# Patient Record
Sex: Male | Born: 1965 | Race: White | Hispanic: No | Smoking: Current every day smoker
Health system: Southern US, Community
[De-identification: ages and names within clinical notes are randomized; demographics above are authoritative.]

## PROBLEM LIST (undated history)

## (undated) DIAGNOSIS — K4020 Bilateral inguinal hernia, without obstruction or gangrene, not specified as recurrent: Secondary | ICD-10-CM

## (undated) DIAGNOSIS — G709 Myoneural disorder, unspecified: Secondary | ICD-10-CM

## (undated) DIAGNOSIS — F1290 Cannabis use, unspecified, uncomplicated: Secondary | ICD-10-CM

## (undated) DIAGNOSIS — M51369 Other intervertebral disc degeneration, lumbar region without mention of lumbar back pain or lower extremity pain: Secondary | ICD-10-CM

## (undated) DIAGNOSIS — J449 Chronic obstructive pulmonary disease, unspecified: Secondary | ICD-10-CM

## (undated) DIAGNOSIS — M5136 Other intervertebral disc degeneration, lumbar region: Secondary | ICD-10-CM

## (undated) DIAGNOSIS — I4891 Unspecified atrial fibrillation: Secondary | ICD-10-CM

## (undated) DIAGNOSIS — M1990 Unspecified osteoarthritis, unspecified site: Secondary | ICD-10-CM

## (undated) DIAGNOSIS — I700 Atherosclerosis of aorta: Secondary | ICD-10-CM

## (undated) DIAGNOSIS — I499 Cardiac arrhythmia, unspecified: Secondary | ICD-10-CM

## (undated) DIAGNOSIS — I480 Paroxysmal atrial fibrillation: Secondary | ICD-10-CM

## (undated) DIAGNOSIS — R011 Cardiac murmur, unspecified: Secondary | ICD-10-CM

## (undated) DIAGNOSIS — M48061 Spinal stenosis, lumbar region without neurogenic claudication: Secondary | ICD-10-CM

## (undated) DIAGNOSIS — F1190 Opioid use, unspecified, uncomplicated: Secondary | ICD-10-CM

## (undated) DIAGNOSIS — K219 Gastro-esophageal reflux disease without esophagitis: Secondary | ICD-10-CM

## (undated) DIAGNOSIS — E785 Hyperlipidemia, unspecified: Secondary | ICD-10-CM

## (undated) DIAGNOSIS — Z7901 Long term (current) use of anticoagulants: Secondary | ICD-10-CM

## (undated) HISTORY — PX: SPINE SURGERY: SHX786

## (undated) HISTORY — DX: Unspecified atrial fibrillation: I48.91

## (undated) HISTORY — PX: PATELLA FRACTURE SURGERY: SHX735

## (undated) HISTORY — DX: Unspecified osteoarthritis, unspecified site: M19.90

## (undated) HISTORY — DX: Hyperlipidemia, unspecified: E78.5

## (undated) HISTORY — PX: HERNIA REPAIR: SHX51

## (undated) HISTORY — DX: Myoneural disorder, unspecified: G70.9

## (undated) HISTORY — DX: Gastro-esophageal reflux disease without esophagitis: K21.9

## (undated) HISTORY — DX: Cardiac arrhythmia, unspecified: I49.9

## (undated) HISTORY — PX: FRACTURE SURGERY: SHX138

## (undated) HISTORY — DX: Spinal stenosis, lumbar region without neurogenic claudication: M48.061

## (undated) HISTORY — DX: Chronic obstructive pulmonary disease, unspecified: J44.9

---

## 2019-03-30 DIAGNOSIS — I2510 Atherosclerotic heart disease of native coronary artery without angina pectoris: Secondary | ICD-10-CM

## 2019-03-30 HISTORY — DX: Atherosclerotic heart disease of native coronary artery without angina pectoris: I25.10

## 2019-03-30 HISTORY — PX: CARDIAC CATHETERIZATION: SHX172

## 2020-09-29 HISTORY — PX: POSTERIOR LUMBAR FUSION: SHX6036

## 2020-09-29 HISTORY — PX: LAMINECTOMY: SHX219

## 2021-05-22 ENCOUNTER — Ambulatory Visit
Admission: RE | Admit: 2021-05-22 | Discharge: 2021-05-22 | Disposition: A | Discharge status: Home / Self Care | Payer: Medicare Other | Attending: Neurological Surgery | Admitting: Neurological Surgery

## 2021-05-22 ENCOUNTER — Ambulatory Visit
Admission: RE | Admit: 2021-05-22 | Discharge: 2021-05-22 | Disposition: A | Discharge status: Home / Self Care | Payer: Medicare Other | Source: Ambulatory Visit | Attending: Neurological Surgery | Admitting: Neurological Surgery

## 2021-05-22 ENCOUNTER — Other Ambulatory Visit: Payer: Self-pay | Admitting: Neurological Surgery

## 2021-05-22 DIAGNOSIS — M4316 Spondylolisthesis, lumbar region: Secondary | ICD-10-CM

## 2021-05-22 DIAGNOSIS — M48061 Spinal stenosis, lumbar region without neurogenic claudication: Secondary | ICD-10-CM

## 2021-05-30 ENCOUNTER — Encounter: Payer: Self-pay | Admitting: Internal Medicine

## 2021-05-30 ENCOUNTER — Ambulatory Visit (INDEPENDENT_AMBULATORY_CARE_PROVIDER_SITE_OTHER): Payer: Medicare Other | Admitting: Internal Medicine

## 2021-05-30 DIAGNOSIS — E782 Mixed hyperlipidemia: Secondary | ICD-10-CM | POA: Diagnosis not present

## 2021-05-30 DIAGNOSIS — M48061 Spinal stenosis, lumbar region without neurogenic claudication: Secondary | ICD-10-CM | POA: Diagnosis not present

## 2021-05-30 DIAGNOSIS — I4811 Longstanding persistent atrial fibrillation: Secondary | ICD-10-CM

## 2021-05-30 DIAGNOSIS — E785 Hyperlipidemia, unspecified: Secondary | ICD-10-CM | POA: Insufficient documentation

## 2021-05-30 DIAGNOSIS — J410 Simple chronic bronchitis: Secondary | ICD-10-CM

## 2021-05-30 DIAGNOSIS — I4891 Unspecified atrial fibrillation: Secondary | ICD-10-CM | POA: Insufficient documentation

## 2021-05-30 DIAGNOSIS — J449 Chronic obstructive pulmonary disease, unspecified: Secondary | ICD-10-CM | POA: Insufficient documentation

## 2021-05-30 MED ORDER — TRAMADOL HCL 50 MG PO TABS: 50.0000 mg | ORAL_TABLET | Freq: Two times a day (BID) | ORAL | 0 refills | Status: AC

## 2021-05-30 MED ORDER — TRAMADOL HCL 50 MG PO TABS: 50.0000 mg | ORAL_TABLET | Freq: Three times a day (TID) | ORAL | 0 refills | Status: DC | PRN

## 2021-05-30 NOTE — Patient Instructions (Signed)
Heart-Healthy Eating Plan Heart-healthy meal planning includes: Eating less unhealthy fats. Eating more healthy fats. Making other changes in your diet. Talk with your doctor or a diet specialist (dietitian) to create an eating plan that is right for you. What is my plan? Your doctor may recommend an eating plan that includes: Total fat: ______% or less of total calories a day. Saturated fat: ______% or less of total calories a day. Cholesterol: less than _________mg a day. What are tips for following this plan? Cooking Avoid frying your food. Try to bake, boil, grill, or broil it instead. You can also reduce fat by: Removing the skin from poultry. Removing all visible fats from meats. Steaming vegetables in water or broth. Meal planning  At meals, divide your plate into four equal parts: Fill one-half of your plate with vegetables and green salads. Fill one-fourth of your plate with whole grains. Fill one-fourth of your plate with lean protein foods. Eat 4-5 servings of vegetables per day. A serving of vegetables is: 1 cup of raw or cooked vegetables. 2 cups of raw leafy greens. Eat 4-5 servings of fruit per day. A serving of fruit is: 1 medium whole fruit.  cup of dried fruit.  cup of fresh, frozen, or canned fruit.  cup of 100% fruit juice. Eat more foods that have soluble fiber. These are apples, broccoli, carrots, beans, peas, and barley. Try to get 20-30 g of fiber per day. Eat 4-5 servings of nuts, legumes, and seeds per week: 1 serving of dried beans or legumes equals  cup after being cooked. 1 serving of nuts is  cup. 1 serving of seeds equals 1 tablespoon. General information Eat more home-cooked food. Eat less restaurant, buffet, and fast food. Limit or avoid alcohol. Limit foods that are high in starch and sugar. Avoid fried foods. Lose weight if you are overweight. Keep track of how much salt (sodium) you eat. This is important if you have high blood  pressure. Ask your doctor to tell you more about this. Try to add vegetarian meals each week. Fats Choose healthy fats. These include olive oil and canola oil, flaxseeds, walnuts, almonds, and seeds. Eat more omega-3 fats. These include salmon, mackerel, sardines, tuna, flaxseed oil, and ground flaxseeds. Try to eat fish at least 2 times each week. Check food labels. Avoid foods with trans fats or high amounts of saturated fat. Limit saturated fats. These are often found in animal products, such as meats, butter, and cream. These are also found in plant foods, such as palm oil, palm kernel oil, and coconut oil. Avoid foods with partially hydrogenated oils in them. These have trans fats. Examples are stick margarine, some tub margarines, cookies, crackers, and other baked goods. What foods can I eat? Fruits All fresh, canned (in natural juice), or frozen fruits. Vegetables Fresh or frozen vegetables (raw, steamed, roasted, or grilled). Green salads. Grains Most grains. Choose whole wheat and whole grains most of the time. Rice and pasta, including brown rice and pastas made with whole wheat. Meats and other proteins Lean, well-trimmed beef, veal, pork, and lamb. Chicken and turkey without skin. All fish and shellfish. Wild duck, rabbit, pheasant, and venison. Egg whites or low-cholesterol egg substitutes. Dried beans, peas, lentils, and tofu. Seeds and most nuts. Dairy Low-fat or nonfat cheeses, including ricotta and mozzarella. Skim or 1% milk that is liquid, powdered, or evaporated. Buttermilk that is made with low-fat milk. Nonfat or low-fat yogurt. Fats and oils Non-hydrogenated (trans-free) margarines. Vegetable oils, including   soybean, sesame, sunflower, olive, peanut, safflower, corn, canola, and cottonseed. Salad dressings or mayonnaise made with a vegetable oil. Beverages Mineral water. Coffee and tea. Diet carbonated beverages. Sweets and desserts Sherbet, gelatin, and fruit ice.  Small amounts of dark chocolate. Limit all sweets and desserts. Seasonings and condiments All seasonings and condiments. The items listed above may not be a complete list of foods and drinks you can eat. Contact a dietitian for more options. What foods should I avoid? Fruits Canned fruit in heavy syrup. Fruit in cream or butter sauce. Fried fruit. Limit coconut. Vegetables Vegetables cooked in cheese, cream, or butter sauce. Fried vegetables. Grains Breads that are made with saturated or trans fats, oils, or whole milk. Croissants. Sweet rolls. Donuts. High-fat crackers, such as cheese crackers. Meats and other proteins Fatty meats, such as hot dogs, ribs, sausage, bacon, rib-eye roast or steak. High-fat deli meats, such as salami and bologna. Caviar. Domestic duck and goose. Organ meats, such as liver. Dairy Cream, sour cream, cream cheese, and creamed cottage cheese. Whole-milk cheeses. Whole or 2% milk that is liquid, evaporated, or condensed. Whole buttermilk. Cream sauce or high-fat cheese sauce. Yogurt that is made from whole milk. Fats and oils Meat fat, or shortening. Cocoa butter, hydrogenated oils, palm oil, coconut oil, palm kernel oil. Solid fats and shortenings, including bacon fat, salt pork, lard, and butter. Nondairy cream substitutes. Salad dressings with cheese or sour cream. Beverages Regular sodas and juice drinks with added sugar. Sweets and desserts Frosting. Pudding. Cookies. Cakes. Pies. Milk chocolate or white chocolate. Buttered syrups. Full-fat ice cream or ice cream drinks. The items listed above may not be a complete list of foods and drinks to avoid. Contact a dietitian for more information. Summary Heart-healthy meal planning includes eating less unhealthy fats, eating more healthy fats, and making other changes in your diet. Eat a balanced diet. This includes fruits and vegetables, low-fat or nonfat dairy, lean protein, nuts and legumes, whole grains, and  heart-healthy oils and fats. This information is not intended to replace advice given to you by your health care provider. Make sure you discuss any questions you have with your health care provider. Document Revised: 05/26/2020 Document Reviewed: 05/26/2020 Elsevier Patient Education  2022 Elsevier Inc.  

## 2021-05-30 NOTE — Assessment & Plan Note (Signed)
We will check c-Met and lipid profile with annual exam ?Encouraged him to consume a low-fat diet ?Continue Atorvastatin ?

## 2021-05-30 NOTE — Assessment & Plan Note (Signed)
Continue Trelegy and Albuterol ?He does not need a referral to pulmonology at this time ?

## 2021-05-30 NOTE — Progress Notes (Signed)
HPI ? ?Pt presents to the clinic today to establish care and for the management with the conditions listed below. ? ?Lumbar Spinal Stenosis: He reports laminectomy 09/2020. He was on Methadone in the past prior to moving down here. He is using Diclofenac gel, Ibuprofen and Tylenol OTC. He would like a referral to pain management. ? ?COPD: He denies chronic cough or shortness of breath. He uses Trelegy and Albuterol as needed with good relief of symptoms. He smokes occasionally. ? ?HLD: There is no lipid profile on file. He denies myalgias on Atorvastatin. He tries to consumes a low fat diet.  ? ?Afib: He is taking Metoprolol and Eliquis as prescribed. He does not follow with cardiology. ? ? ?Current Outpatient Medications  ?Medication Sig Dispense Refill  ? albuterol (PROVENTIL) (2.5 MG/3ML) 0.083% nebulizer solution SMARTSIG:1 Vial(s) Via Nebulizer 4 Times Daily PRN    ? albuterol (VENTOLIN HFA) 108 (90 Base) MCG/ACT inhaler SMARTSIG:2 Puff(s) Via Inhaler 3 Times Daily PRN    ? atorvastatin (LIPITOR) 10 MG tablet Take 10 mg by mouth daily.    ? diclofenac Sodium (VOLTAREN) 1 % GEL Apply 2 g topically 4 (four) times daily.    ? ELIQUIS 5 MG TABS tablet Take 5 mg by mouth 2 (two) times daily.    ? metoprolol succinate (TOPROL-XL) 25 MG 24 hr tablet Take 25 mg by mouth 2 (two) times daily.    ? TRELEGY ELLIPTA 100-62.5-25 MCG/ACT AEPB Inhale 1 puff into the lungs daily.    ? ?No current facility-administered medications for this visit.  ? ? ?No Known Allergies ? ?Family History  ?Problem Relation Age of Onset  ? Heart disease Father   ? Healthy Sister   ? Healthy Brother   ? Colon cancer Neg Hx   ? Prostate cancer Neg Hx   ? ? ?Social History  ? ?Socioeconomic History  ? Marital status: Significant Other  ?  Spouse name: Not on file  ? Number of children: Not on file  ? Years of education: Not on file  ? Highest education level: Not on file  ?Occupational History  ? Not on file  ?Tobacco Use  ? Smoking status: Former   ?  Types: Cigarettes  ? Smokeless tobacco: Never  ?Vaping Use  ? Vaping Use: Never used  ?Substance and Sexual Activity  ? Alcohol use: Not on file  ? Drug use: Not on file  ? Sexual activity: Not on file  ?Other Topics Concern  ? Not on file  ?Social History Narrative  ? Not on file  ? ?Social Determinants of Health  ? ?Financial Resource Strain: Not on file  ?Food Insecurity: Not on file  ?Transportation Needs: Not on file  ?Physical Activity: Not on file  ?Stress: Not on file  ?Social Connections: Not on file  ?Intimate Partner Violence: Not on file  ? ? ?ROS: ? ?Constitutional: Denies fever, malaise, fatigue, headache or abrupt weight changes.  ?HEENT: Denies eye pain, eye redness, ear pain, ringing in the ears, wax buildup, runny nose, nasal congestion, bloody nose, or sore throat. ?Respiratory: Denies difficulty breathing, shortness of breath, cough or sputum production.   ?Cardiovascular: Denies chest pain, chest tightness, palpitations or swelling in the hands or feet.  ?Gastrointestinal: Denies abdominal pain, bloating, constipation, diarrhea or blood in the stool.  ?GU: Denies frequency, urgency, pain with urination, blood in urine, odor or discharge. ?Musculoskeletal: Pt reports chronic low back pain. Denies decrease in range of motion, difficulty with gait, muscle pain  or joint swelling.  ?Skin: Denies redness, rashes, lesions or ulcercations.  ?Neurological: Denies dizziness, difficulty with memory, difficulty with speech or problems with balance and coordination.  ?Psych: Denies anxiety, depression, SI/HI. ? ?No other specific complaints in a complete review of systems (except as listed in HPI above). ? ?PE: ? ?BP 108/64 (BP Location: Left Arm, Patient Position: Sitting, Cuff Size: Normal)   Pulse 84   Temp 97.7 ?F (36.5 ?C) (Temporal)   Wt 167 lb (75.8 kg)   SpO2 98%  ?Wt Readings from Last 3 Encounters:  ?05/30/21 167 lb (75.8 kg)  ? ? ?General: Appears his stated age, well developed, well  nourished in NAD. ?HEENT: Head: normal shape and size; Eyes: sclera white and EOMs intact;  ?Cardiovascular: Normal rate and rhythm. S1,S2 noted.  No murmur, rubs or gallops noted. No JVD or BLE edema. No carotid bruits noted. ?Pulmonary/Chest: Normal effort and positive vesicular breath sounds. No respiratory distress. No wheezes, rales or ronchi noted.  ?Musculoskeletal: No bony tenderness noted over the spine. Strength 5/5 BLE. No difficulty with gait. ?Neurological: Alert and oriented.  ?Psychiatric: Mood and affect normal. Behavior is normal. Judgment and thought content normal.  ? ? ? ?Assessment and Plan: ? ?Webb Silversmith, NP ? ?

## 2021-05-30 NOTE — Assessment & Plan Note (Signed)
Status post laminectomy ?Continue Ibuprofen, Tylenol, Diclofenac gel as needed ?Rx for Tramadol 50 mg twice daily ?CSA and UDS today ?

## 2021-05-30 NOTE — Assessment & Plan Note (Signed)
Continue Metoprolol and Eliquis ?He does not need a referral to cardiology at this time ?

## 2021-06-01 ENCOUNTER — Other Ambulatory Visit: Payer: Self-pay | Admitting: Internal Medicine

## 2021-06-01 NOTE — Telephone Encounter (Signed)
Requested medication (s) are due for refill today:  ? ?Requested medication (s) are on the active medication list: Yes ? ?Last refill:  05/16/21 ? ?Future visit scheduled: No ? ?Notes to clinic:  Historical provider. Protocol indicates pt. Needs lab work. ? ? ? ?Requested Prescriptions  ?Pending Prescriptions Disp Refills  ? ELIQUIS 5 MG TABS tablet [Pharmacy Med Name: ELIQUIS 5 MG TABLET] 60 tablet 1  ?  Sig: TAKE 1 TABLET BY MOUTH TWICE A DAY AS DIRECTED  ?  ? Hematology:  Anticoagulants - apixaban Failed - 06/01/2021 12:56 PM  ?  ?  Failed - PLT in normal range and within 360 days  ?  No results found for: PLT, PLTCOUNTKUC, LABPLAT, POCPLA  ?  ?  ?  Failed - HGB in normal range and within 360 days  ?  No results found for: HGB, HGBKUC, HGBPOCKUC, HGBOTHER, TOTHGB, HGBPLASMA  ?  ?  ?  Failed - HCT in normal range and within 360 days  ?  No results found for: HCT, HCTKUC, SRHCT  ?  ?  ?  Failed - Cr in normal range and within 360 days  ?  No results found for: CREATININE, LABCREAU, LABCREA, POCCRE  ?  ?  ?  Failed - AST in normal range and within 360 days  ?  No results found for: POCAST, AST  ?  ?  ?  Failed - ALT in normal range and within 360 days  ?  No results found for: ALT, LABALT, POCALT  ?  ?  ?  Passed - Valid encounter within last 12 months  ?  Recent Outpatient Visits   ? ?      ? 2 days ago Spinal stenosis of lumbar region without neurogenic claudication  ? [facility] [place], Coralie Keens, NP  ? ?  ?  ? ? ?  ?  ?  ? ?

## 2021-06-02 LAB — DRUG MONITORING, PANEL 8 WITH CONFIRMATION, URINE
6 Acetylmorphine: NEGATIVE ng/mL (ref <10)
Alcohol Metabolites: NEGATIVE ng/mL (ref <500)
Amphetamines: NEGATIVE ng/mL (ref <500)
Benzodiazepines: NEGATIVE ng/mL (ref <100)
Buprenorphine, Urine: NEGATIVE ng/mL (ref <5)
Cocaine Metabolite: NEGATIVE ng/mL (ref <150)
Creatinine: 18.8 mg/dL — ABNORMAL LOW (ref >=20.0)
MDMA: NEGATIVE ng/mL (ref <500)
Marijuana Metabolite: 169 ng/mL — ABNORMAL HIGH (ref <5)
Marijuana Metabolite: POSITIVE ng/mL — AB (ref <20)
Opiates: NEGATIVE ng/mL (ref <100)
Oxidant: NEGATIVE ug/mL (ref <200)
Oxycodone: NEGATIVE ng/mL (ref <100)
Specific Gravity: 1.003 (ref >=1.003)
pH: 6.5 (ref 4.5–9.0)

## 2021-06-02 LAB — DM TEMPLATE

## 2021-06-19 ENCOUNTER — Other Ambulatory Visit: Payer: Self-pay | Admitting: Internal Medicine

## 2021-06-19 NOTE — Telephone Encounter (Signed)
Medication Refill - Medication: metoprolol succinate (TOPROL-XL) 25 MG 24 hr tablet [[id number]]   Has the patient contacted their pharmacy? Yes.     Preferred Pharmacy (with phone number or street name):  [phone] [place], [place] - [address]  [address] [place] [address]  Phone: [phone] Fax: [phone]  Hours: Not open 24 hours     Has the patient been seen for an appointment in the last year OR does the patient have an upcoming appointment? Yes.    Agent: Please be advised that RX refills may take up to 3 business days. We ask that you follow-up with your pharmacy.

## 2021-06-20 NOTE — Telephone Encounter (Signed)
Requested medication (s) are due for refill today - yes  Requested medication (s) are on the active medication list -yes  Future visit scheduled -no  Last refill: 05/22/21  Notes to clinic: historical provider  Requested Prescriptions  Pending Prescriptions Disp Refills   metoprolol succinate (TOPROL-XL) 25 MG 24 hr tablet 90 tablet 0    Sig: Take 1 tablet (25 mg total) by mouth 2 (two) times daily.     Cardiovascular:  Beta Blockers Passed - 06/20/2021  8:12 AM      Passed - Last BP in normal range    BP Readings from Last 1 Encounters:  05/30/21 108/64         Passed - Last Heart Rate in normal range    Pulse Readings from Last 1 Encounters:  05/30/21 84         Passed - Valid encounter within last 6 months    Recent Outpatient Visits           3 weeks ago Spinal stenosis of lumbar region without neurogenic claudication   [facility] [place], Salvadore Oxford, NP                  Requested Prescriptions  Pending Prescriptions Disp Refills   metoprolol succinate (TOPROL-XL) 25 MG 24 hr tablet 90 tablet 0    Sig: Take 1 tablet (25 mg total) by mouth 2 (two) times daily.     Cardiovascular:  Beta Blockers Passed - 06/20/2021  8:12 AM      Passed - Last BP in normal range    BP Readings from Last 1 Encounters:  05/30/21 108/64         Passed - Last Heart Rate in normal range    Pulse Readings from Last 1 Encounters:  05/30/21 84         Passed - Valid encounter within last 6 months    Recent Outpatient Visits           3 weeks ago Spinal stenosis of lumbar region without neurogenic claudication   [facility] [place], Salvadore Oxford, NP

## 2021-06-21 MED ORDER — METOPROLOL SUCCINATE ER 25 MG PO TB24
25.0000 mg | ORAL_TABLET | Freq: Two times a day (BID) | ORAL | 1 refills | Status: DC
Start: 2021-06-21 — End: 2021-09-18

## 2021-06-30 LAB — FECAL OCCULT BLOOD, GUAIAC: Fecal Occult Blood: NEGATIVE

## 2021-07-03 ENCOUNTER — Other Ambulatory Visit: Payer: Self-pay | Admitting: Internal Medicine

## 2021-07-03 NOTE — Telephone Encounter (Signed)
Medication Refill - Medication: TRELEGY ELLIPTA 100-62.5-25 MCG/ACT AEPB traMADol (ULTRAM) 50 MG tablet  Pt is completely out of current supply and is requesting a same day refill   Has the patient contacted their pharmacy? Yes.   (Agent: If no, request that the patient contact the pharmacy for the refill. If patient does not wish to contact the pharmacy document the reason why and proceed with request.) (Agent: If yes, when and what did the pharmacy advise?)  Preferred Pharmacy (with phone number or street name):  [id number] [place], [place] - [place]  [place] [address]  Phone: [phone] Fax: [phone]   Has the patient been seen for an appointment in the last year OR does the patient have an upcoming appointment? Yes.    Agent: Please be advised that RX refills may take up to 3 business days. We ask that you follow-up with your pharmacy.

## 2021-07-04 MED ORDER — TRAMADOL HCL 50 MG PO TABS: 50.0000 mg | ORAL_TABLET | Freq: Three times a day (TID) | ORAL | 0 refills | Status: DC | PRN

## 2021-07-04 MED ORDER — TRELEGY ELLIPTA 100-62.5-25 MCG/ACT IN AEPB: 1.0000 | INHALATION_SPRAY | Freq: Every day | RESPIRATORY_TRACT | 1 refills | Status: DC

## 2021-07-04 NOTE — Telephone Encounter (Signed)
Requested medication (s) are due for refill today: yes  Requested medication (s) are on the active medication list: yes  Last refill:  05/30/21  Future visit scheduled: yes  Notes to clinic:  Unable to refill per protocol, cannot delegate.     Requested Prescriptions  Pending Prescriptions Disp Refills   TRELEGY ELLIPTA 100-62.5-25 MCG/ACT AEPB      Sig: Inhale 1 puff into the lungs daily.     Off-Protocol Failed - 07/03/2021 12:06 PM      Failed - Medication not assigned to a protocol, review manually.      Passed - Valid encounter within last 12 months    Recent Outpatient Visits           1 month ago Spinal stenosis of lumbar region without neurogenic claudication   [facility] [place], Salvadore Oxford, NP                traMADol (ULTRAM) 50 MG tablet 60 tablet 0    Sig: Take 1 tablet (50 mg total) by mouth every 8 (eight) hours as needed.     Not Delegated - Analgesics:  Opioid Agonists Failed - 07/03/2021 12:06 PM      Failed - This refill cannot be delegated      Failed - Urine Drug Screen completed in last 360 days      Passed - Valid encounter within last 3 months    Recent Outpatient Visits           1 month ago Spinal stenosis of lumbar region without neurogenic claudication   [facility] [place], Salvadore Oxford, NP

## 2021-07-13 ENCOUNTER — Telehealth: Payer: Self-pay

## 2021-07-13 NOTE — Telephone Encounter (Signed)
Left a message for patient to call back and schedule their Medicare Annual Wellness Visit (AWV) virtually, telephone or face to face. ?  ?Adrianne Shackleton, CMA ?[phone]  ?

## 2021-07-27 ENCOUNTER — Other Ambulatory Visit: Payer: Self-pay | Admitting: Internal Medicine

## 2021-07-27 NOTE — Telephone Encounter (Signed)
Requested medication (s) are due for refill today - provider review   Requested medication (s) are on the active medication list -yes  Future visit scheduled -no  Last refill: 07/04/21 #60  Notes to clinic: non delegated Rx  Requested Prescriptions  Pending Prescriptions Disp Refills   traMADol (ULTRAM) 50 MG tablet [Pharmacy Med Name: TRAMADOL HCL 50 MG TABLET] 21 tablet 2    Sig: TAKE 1 TABLET BY MOUTH EVERY 8 HOURS AS NEEDED. (QUANTITY LIMIT)     Not Delegated - Analgesics:  Opioid Agonists Failed - 07/27/2021  7:56 AM      Failed - This refill cannot be delegated      Failed - Urine Drug Screen completed in last 360 days      Passed - Valid encounter within last 3 months    Recent Outpatient Visits           1 month ago Spinal stenosis of lumbar region without neurogenic claudication   [facility] [place], Salvadore Oxford, NP                 Requested Prescriptions  Pending Prescriptions Disp Refills   traMADol (ULTRAM) 50 MG tablet [Pharmacy Med Name: TRAMADOL HCL 50 MG TABLET] 21 tablet 2    Sig: TAKE 1 TABLET BY MOUTH EVERY 8 HOURS AS NEEDED. (QUANTITY LIMIT)     Not Delegated - Analgesics:  Opioid Agonists Failed - 07/27/2021  7:56 AM      Failed - This refill cannot be delegated      Failed - Urine Drug Screen completed in last 360 days      Passed - Valid encounter within last 3 months    Recent Outpatient Visits           1 month ago Spinal stenosis of lumbar region without neurogenic claudication   [facility] [place], Salvadore Oxford, NP

## 2021-07-28 ENCOUNTER — Other Ambulatory Visit: Payer: Self-pay | Admitting: Internal Medicine

## 2021-07-28 NOTE — Telephone Encounter (Signed)
Requested medication (s) are due for refill today: signed 07/27/21  Requested medication (s) are on the active medication list: yes  Last refill:  07/27/21 #21 2 refills  Future visit scheduled: no   Notes to clinic:  not delegated per protocol. Signed 07/27/21 Maximum MME cannot be calculated for this prescription. Enter discrete sig details to calculate maximum MME.  Duplicate request.     Requested Prescriptions  Pending Prescriptions Disp Refills   traMADol (ULTRAM) 50 MG tablet [Pharmacy Med Name: TRAMADOL HCL 50 MG TABLET] 60 tablet 0    Sig: TAKE 1 TABLET BY MOUTH EVERY 8 HOURS AS NEEDED.     Not Delegated - Analgesics:  Opioid Agonists Failed - 07/28/2021  9:17 AM      Failed - This refill cannot be delegated      Failed - Urine Drug Screen completed in last 360 days      Passed - Valid encounter within last 3 months    Recent Outpatient Visits           1 month ago Spinal stenosis of lumbar region without neurogenic claudication   [facility] [place], Salvadore Oxford, NP

## 2021-08-02 ENCOUNTER — Ambulatory Visit: Payer: Self-pay | Admitting: Nurse Practitioner

## 2021-08-28 ENCOUNTER — Other Ambulatory Visit: Payer: Self-pay | Admitting: Internal Medicine

## 2021-08-29 NOTE — Telephone Encounter (Signed)
Requested medication (s) are due for refill today - yes  Requested medication (s) are on the active medication list -provider review   Future visit scheduled -no  Last refill: 07/04/21 #60 1RF  Notes to clinic: medication not assigned protocol- provider review   Requested Prescriptions  Pending Prescriptions Disp Refills   TRELEGY ELLIPTA 100-62.5-25 MCG/ACT AEPB [Pharmacy Med Name: TRELEGY ELLIPTA 100-62.5-25] 60 each 1    Sig: TAKE 1 PUFF BY MOUTH EVERY DAY     Off-Protocol Failed - 08/28/2021  1:53 AM      Failed - Medication not assigned to a protocol, review manually.      Passed - Valid encounter within last 12 months    Recent Outpatient Visits           3 months ago Spinal stenosis of lumbar region without neurogenic claudication   [facility] [place], Salvadore Oxford, NP                 Requested Prescriptions  Pending Prescriptions Disp Refills   TRELEGY ELLIPTA 100-62.5-25 MCG/ACT AEPB [Pharmacy Med Name: TRELEGY ELLIPTA 100-62.5-25] 60 each 1    Sig: TAKE 1 PUFF BY MOUTH EVERY DAY     Off-Protocol Failed - 08/28/2021  1:53 AM      Failed - Medication not assigned to a protocol, review manually.      Passed - Valid encounter within last 12 months    Recent Outpatient Visits           3 months ago Spinal stenosis of lumbar region without neurogenic claudication   [facility] [place], Salvadore Oxford, NP

## 2021-09-02 ENCOUNTER — Other Ambulatory Visit: Payer: Self-pay | Admitting: Internal Medicine

## 2021-09-04 NOTE — Telephone Encounter (Signed)
Requested medication (s) are due for refill today:   Provider to review  Requested medication (s) are on the active medication list:   Yes  Future visit scheduled:   No   Last ordered: 07/27/2021 #21, 2 refills  Non delegated refill reason returned   Requested Prescriptions  Pending Prescriptions Disp Refills   traMADol (ULTRAM) 50 MG tablet [Pharmacy Med Name: TRAMADOL HCL 50 MG TABLET] 21 tablet 2    Sig: TAKE 1 TABLET BY MOUTH EVERY 8 HOURS AS NEEDED. (QUANTITY LIMIT) FILL ON OR AFTER 08/02/2021     Not Delegated - Analgesics:  Opioid Agonists Failed - 09/02/2021 12:25 PM      Failed - This refill cannot be delegated      Failed - Urine Drug Screen completed in last 360 days      Failed - Valid encounter within last 3 months    Recent Outpatient Visits           3 months ago Spinal stenosis of lumbar region without neurogenic claudication   [facility] [place], Salvadore Oxford, NP

## 2021-09-16 ENCOUNTER — Other Ambulatory Visit: Payer: Self-pay | Admitting: Internal Medicine

## 2021-09-18 NOTE — Telephone Encounter (Signed)
Requested Prescriptions  Pending Prescriptions Disp Refills  . metoprolol succinate (TOPROL-XL) 25 MG 24 hr tablet [Pharmacy Med Name: METOPROLOL SUCC ER 25 MG TAB] 180 tablet 0    Sig: TAKE 1 TABLET BY MOUTH TWICE A DAY     Cardiovascular:  Beta Blockers Passed - 09/16/2021  9:11 AM      Passed - Last BP in normal range    BP Readings from Last 1 Encounters:  05/30/21 108/64         Passed - Last Heart Rate in normal range    Pulse Readings from Last 1 Encounters:  05/30/21 84         Passed - Valid encounter within last 6 months    Recent Outpatient Visits          3 months ago Spinal stenosis of lumbar region without neurogenic claudication   [facility] [place], Salvadore Oxford, NP             '

## 2021-10-02 ENCOUNTER — Other Ambulatory Visit: Payer: Self-pay | Admitting: Internal Medicine

## 2021-10-03 ENCOUNTER — Other Ambulatory Visit: Payer: Self-pay | Admitting: Internal Medicine

## 2021-10-04 NOTE — Telephone Encounter (Signed)
Pt wife Harriett Sine stated pt is out of the medication as of today.   Please advise.

## 2021-10-04 NOTE — Telephone Encounter (Signed)
Requested medication (s) are due for refill today: yes  Requested medication (s) are on the active medication list: yes    Last refill: 09/04/21  #60  0 refills  Future visit scheduled yes 10/06/21  Notes to clinic:Not delegated, please review. Thank you.  Requested Prescriptions  Pending Prescriptions Disp Refills   traMADol (ULTRAM) 50 MG tablet [Pharmacy Med Name: TRAMADOL HCL 50 MG TABLET] 60 tablet 0    Sig: TAKE 1 TABLET BY MOUTH 2 TIMES DAILY AS NEEDED.     Not Delegated - Analgesics:  Opioid Agonists Failed - 10/04/2021  8:16 AM      Failed - This refill cannot be delegated      Failed - Urine Drug Screen completed in last 360 days      Failed - Valid encounter within last 3 months    Recent Outpatient Visits           4 months ago Spinal stenosis of lumbar region without neurogenic claudication   [facility] [place], Salvadore Oxford, NP       Future Appointments             In 2 days [place], Salvadore Oxford, NP [facility], [facility]

## 2021-10-05 NOTE — Telephone Encounter (Signed)
Requested Prescriptions  Pending Prescriptions Disp Refills  . ELIQUIS 5 MG TABS tablet [Pharmacy Med Name: ELIQUIS 5 MG TABLET] 180 tablet 1    Sig: TAKE 1 TABLET BY MOUTH TWICE A DAY AS DIRECTED     Hematology:  Anticoagulants - apixaban Failed - 10/03/2021  4:49 PM      Failed - PLT in normal range and within 360 days    No results found for: "PLT", "PLTCOUNTKUC", "LABPLAT", "POCPLA"       Failed - HGB in normal range and within 360 days    No results found for: "HGB", "HGBKUC", "HGBPOCKUC", "HGBOTHER", "TOTHGB", "HGBPLASMA"       Failed - HCT in normal range and within 360 days    No results found for: "HCT", "HCTKUC", "SRHCT"       Failed - Cr in normal range and within 360 days    No results found for: "CREATININE", "LABCREAU", "LABCREA", "POCCRE"       Failed - AST in normal range and within 360 days    No results found for: "POCAST", "AST"       Failed - ALT in normal range and within 360 days    No results found for: "ALT", "LABALT", "POCALT"       Passed - Valid encounter within last 12 months    Recent Outpatient Visits          4 months ago Spinal stenosis of lumbar region without neurogenic claudication   [facility] [place], Salvadore Oxford, NP      Future Appointments            Tomorrow Lorre Munroe, NP [facility]           . metoprolol succinate (TOPROL-XL) 25 MG 24 hr tablet [Pharmacy Med Name: METOPROLOL SUCC ER 25 MG TAB] 180 tablet 0    Sig: TAKE 1 TABLET BY MOUTH TWICE A DAY     Cardiovascular:  Beta Blockers Passed - 10/03/2021  4:49 PM      Passed - Last BP in normal range    BP Readings from Last 1 Encounters:  05/30/21 108/64         Passed - Last Heart Rate in normal range    Pulse Readings from Last 1 Encounters:  05/30/21 84         Passed - Valid encounter within last 6 months    Recent Outpatient Visits          4 months ago Spinal stenosis of lumbar region without neurogenic claudication   [facility] [place], Salvadore Oxford, NP      Future Appointments            Tomorrow Sampson Si, Salvadore Oxford, NP [facility], [facility]

## 2021-10-05 NOTE — Telephone Encounter (Signed)
Requested medication (s) are due for refill today: No  Requested medication (s) are on the active medication list: yes    Last refill:    06/02/21    #180  1 refill  Future visit scheduled  yes 10/06/21  Notes to clinic:Failed due to labs. Pt has appt tomorrow 10/06/21, please review. Thank you.  Requested Prescriptions  Pending Prescriptions Disp Refills   ELIQUIS 5 MG TABS tablet [Pharmacy Med Name: ELIQUIS 5 MG TABLET] 180 tablet 1    Sig: TAKE 1 TABLET BY MOUTH TWICE A DAY AS DIRECTED     Hematology:  Anticoagulants - apixaban Failed - 10/03/2021  4:49 PM      Failed - PLT in normal range and within 360 days    No results found for: "PLT", "PLTCOUNTKUC", "LABPLAT", "POCPLA"       Failed - HGB in normal range and within 360 days    No results found for: "HGB", "HGBKUC", "HGBPOCKUC", "HGBOTHER", "TOTHGB", "HGBPLASMA"       Failed - HCT in normal range and within 360 days    No results found for: "HCT", "HCTKUC", "SRHCT"       Failed - Cr in normal range and within 360 days    No results found for: "CREATININE", "LABCREAU", "LABCREA", "POCCRE"       Failed - AST in normal range and within 360 days    No results found for: "POCAST", "AST"       Failed - ALT in normal range and within 360 days    No results found for: "ALT", "LABALT", "POCALT"       Passed - Valid encounter within last 12 months    Recent Outpatient Visits           4 months ago Spinal stenosis of lumbar region without neurogenic claudication   [facility] [place], Salvadore Oxford, NP       Future Appointments             Tomorrow Lorre Munroe, NP [facility], [facility]            Refused Prescriptions Disp Refills   metoprolol succinate (TOPROL-XL) 25 MG 24 hr tablet [Pharmacy Med Name: METOPROLOL SUCC ER 25 MG TAB] 180 tablet 0    Sig: TAKE 1 TABLET BY MOUTH TWICE A DAY     Cardiovascular:  Beta Blockers Passed - 10/03/2021  4:49 PM      Passed - Last BP in normal range    BP  Readings from Last 1 Encounters:  05/30/21 108/64         Passed - Last Heart Rate in normal range    Pulse Readings from Last 1 Encounters:  05/30/21 84         Passed - Valid encounter within last 6 months    Recent Outpatient Visits           4 months ago Spinal stenosis of lumbar region without neurogenic claudication   [facility] [place], Salvadore Oxford, NP       Future Appointments             Tomorrow Sampson Si, Salvadore Oxford, NP [facility], [facility]

## 2021-10-06 ENCOUNTER — Encounter: Payer: Self-pay | Admitting: Internal Medicine

## 2021-10-06 ENCOUNTER — Ambulatory Visit (INDEPENDENT_AMBULATORY_CARE_PROVIDER_SITE_OTHER): Payer: Medicare Other | Admitting: Internal Medicine

## 2021-10-06 VITALS — BP 132/68 | HR 87 | Ht 69.0 in | Wt 172.2 lb

## 2021-10-06 DIAGNOSIS — K4090 Unilateral inguinal hernia, without obstruction or gangrene, not specified as recurrent: Secondary | ICD-10-CM

## 2021-10-06 NOTE — Progress Notes (Signed)
Subjective:    Patient ID: Jeremy Turner, male    DOB: [birth year], 56 y.o.   MRN: [id number]  HPI  Patient presents to clinic today with complaint of a mass in his left inguinal area.  He noticed this 2 months ago after lifting a heavy bag of dirt.  He reports that the mass will get bigger and smaller at times depending on what he is doing.  Sometimes it is tender.  He has not noticed any change in his bowel or bladder habits.  He has not taken any medication over the counter for this.  Review of Systems     Past Medical History:  Diagnosis Date   A-fib (HCC)    COPD (chronic obstructive pulmonary disease) (HCC)    HLD (hyperlipidemia)    Stenosis of lateral recess of lumbar spine     Current Outpatient Medications  Medication Sig Dispense Refill   albuterol (PROVENTIL) (2.5 MG/3ML) 0.083% nebulizer solution SMARTSIG:1 Vial(s) Via Nebulizer 4 Times Daily PRN     albuterol (VENTOLIN HFA) 108 (90 Base) MCG/ACT inhaler SMARTSIG:2 Puff(s) Via Inhaler 3 Times Daily PRN     atorvastatin (LIPITOR) 10 MG tablet Take 10 mg by mouth daily.     diclofenac Sodium (VOLTAREN) 1 % GEL Apply 2 g topically 4 (four) times daily.     ELIQUIS 5 MG TABS tablet TAKE 1 TABLET BY MOUTH TWICE A DAY AS DIRECTED 180 tablet 1   metoprolol succinate (TOPROL-XL) 25 MG 24 hr tablet TAKE 1 TABLET BY MOUTH TWICE A DAY 180 tablet 0   traMADol (ULTRAM) 50 MG tablet TAKE 1 TABLET BY MOUTH 2 TIMES DAILY AS NEEDED. 60 tablet 0   TRELEGY ELLIPTA 100-62.5-25 MCG/ACT AEPB TAKE 1 PUFF BY MOUTH EVERY DAY 60 each 2   No current facility-administered medications for this visit.    No Known Allergies  Family History  Problem Relation Age of Onset   Heart disease Father    Healthy Sister    Healthy Brother    Colon cancer Neg Hx    Prostate cancer Neg Hx     Social History   Socioeconomic History   Marital status: Significant Other    Spouse name: Not on file   Number of children: Not on file   Years of  education: Not on file   Highest education level: Not on file  Occupational History   Not on file  Tobacco Use   Smoking status: Former    Types: Cigarettes   Smokeless tobacco: Never  Vaping Use   Vaping Use: Never used  Substance and Sexual Activity   Alcohol use: Not on file   Drug use: Not on file   Sexual activity: Not on file  Other Topics Concern   Not on file  Social History Narrative   Not on file   Social Determinants of Health   Financial Resource Strain: Not on file  Food Insecurity: Not on file  Transportation Needs: Not on file  Physical Activity: Not on file  Stress: Not on file  Social Connections: Not on file  Intimate Partner Violence: Not on file     Constitutional: Denies fever, malaise, fatigue, headache or abrupt weight changes.  Respiratory: Denies difficulty breathing, shortness of breath, cough or sputum production.   Cardiovascular: Denies chest pain, chest tightness, palpitations or swelling in the hands or feet.  Gastrointestinal: Denies abdominal pain, bloating, constipation, diarrhea or blood in the stool.  GU: Denies urgency, frequency, pain with urination,  burning sensation, blood in urine, odor or discharge. Musculoskeletal: Patient reports mass of left groin.  Denies decrease in range of motion, difficulty with gait, muscle pain or joint pain and swelling.  Skin: Denies redness, rashes, lesions or ulcercations.   No other specific complaints in a complete review of systems (except as listed in HPI above).  Objective:   Physical Exam   BP 132/68   Pulse 87   Ht 5\' 9"  (1.753 m)   Wt 172 lb 3.2 oz (78.1 kg)   SpO2 100%   BMI 25.43 kg/m   Wt Readings from Last 3 Encounters:  05/30/21 167 lb (75.8 kg)    General: Appears his stated age, overweight, in NAD. Cardiovascular: Normal rate with irregular rhythm. S1,S2 noted.  No murmur, rubs or gallops noted.  Pulmonary/Chest: Normal effort and positive vesicular breath sounds. No  respiratory distress. No wheezes, rales or ronchi noted.  Abdomen: Soft and nontender. Normal bowel sounds. No distention or masses noted. GU: Direct left inguinal hernia noted on exam. Musculoskeletal: No difficulty with gait.  Neurological: Alert and oriented.     Assessment & Plan:   Direct Left Inguinal Hernia:  No need for imaging given the fact that there is palpable hernia on exam Avoid heavy lifting or straining Referral to general surgery for further evaluation and treatment  RTC in 2 months for your annual exam 07/30/21, NP

## 2021-10-06 NOTE — Patient Instructions (Signed)
Inguinal Hernia, Adult An inguinal hernia is when fat or your intestines push through a weak spot in a muscle where your leg meets your lower belly (groin). This causes a bulge. This kind of hernia could also be: In your scrotum, if you are male. In folds of skin around your vagina, if you are male. There are three types of inguinal hernias: Hernias that can be pushed back into the belly (are reducible). This type rarely causes pain. Hernias that cannot be pushed back into the belly (are incarcerated). Hernias that cannot be pushed back into the belly and lose their blood supply (are strangulated). This type needs emergency surgery. What are the causes? This condition is caused by having a weak spot in the muscles or tissues in your groin. This develops over time. The hernia may poke through the weak spot when you strain your lower belly muscles all of a sudden, such as when you: Lift a heavy object. Strain to poop (have a bowel movement). Trouble pooping (constipation) can lead to straining. Cough. What increases the risk? This condition is more likely to develop in: Males. Pregnant females. People who: Are overweight. Work in jobs that require long periods of standing or heavy lifting. Have had an inguinal hernia before. Smoke or have lung disease. These factors can lead to long-term (chronic) coughing. What are the signs or symptoms? Symptoms may depend on the size of the hernia. Often, a small hernia has no symptoms. Symptoms of a larger hernia may include: A bulge in the groin area. This is easier to see when standing. You might not be able to see it when you are lying down. Pain or burning in the groin. This may get worse when you lift, strain, or cough. A dull ache or a feeling of pressure in the groin. An abnormal bulge in the scrotum, in males. Symptoms of a strangulated inguinal hernia may include: A bulge in your groin that is very painful and tender to the touch. A bulge  that turns red or purple. Fever, feeling like you may vomit (nausea), and vomiting. Not being able to poop or to pass gas. How is this treated? Treatment depends on the size of your hernia and whether you have symptoms. If you do not have symptoms, your doctor may have you watch your hernia carefully and have you come in for follow-up visits. If your hernia is large or if you have symptoms, you may need surgery to repair the hernia. Follow these instructions at home: Lifestyle Avoid lifting heavy objects. Avoid standing for long amounts of time. Do not smoke or use any products that contain nicotine or tobacco. If you need help quitting, ask your doctor. Stay at a healthy weight. Prevent trouble pooping You may need to take these actions to prevent or treat trouble pooping: Drink enough fluid to keep your pee (urine) pale yellow. Take over-the-counter or prescription medicines. Eat foods that are high in fiber. These include beans, whole grains, and fresh fruits and vegetables. Limit foods that are high in fat and sugar. These include fried or sweet foods. General instructions You may try to push your hernia back in place by very gently pressing on it when you are lying down. Do not try to push the bulge back in if it will not go in easily. Watch your hernia for any changes in shape, size, or color. Tell your doctor if you see any changes. Take over-the-counter and prescription medicines only as told by your doctor. Keep   all follow-up visits. Contact a doctor if: You have a fever or chills. You have new symptoms. Your symptoms get worse. Get help right away if: You have pain in your groin that gets worse all of a sudden. You have a bulge in your groin that: Gets bigger all of a sudden, and it does not get smaller after that. Turns red or purple. Is painful when you touch it. You are a male, and you have: Sudden pain in your scrotum. A sudden change in the size of your scrotum. You  cannot push the hernia back in place by very gently pressing on it when you are lying down. You feel like you may vomit, and that feeling does not go away. You keep vomiting. You have a fast heartbeat. You cannot poop or pass gas. These symptoms may be an emergency. Get help right away. Call your local emergency services (911 in the [place] Do not wait to see if the symptoms will go away. Do not drive yourself to the hospital. Summary An inguinal hernia is when fat or your intestines push through a weak spot in a muscle where your leg meets your lower belly (groin). This causes a bulge. If you do not have symptoms, you may not need treatment. If you have symptoms or a large hernia, you may need surgery. Avoid lifting heavy objects. Also, avoid standing for long amounts of time. Do not try to push the bulge back in if it will not go in easily. This information is not intended to replace advice given to you by your health care provider. Make sure you discuss any questions you have with your health care provider. Document Revised: 09/15/2019 Document Reviewed: 09/15/2019 Elsevier Patient Education  2023 Elsevier Inc[employer]  

## 2021-10-20 ENCOUNTER — Ambulatory Visit: Payer: Medicare Other | Admitting: Surgery

## 2021-10-20 ENCOUNTER — Telehealth: Payer: Self-pay

## 2021-10-20 ENCOUNTER — Other Ambulatory Visit: Payer: Self-pay

## 2021-10-20 ENCOUNTER — Encounter: Payer: Self-pay | Admitting: Surgery

## 2021-10-20 VITALS — BP 135/77 | HR 75 | Temp 98.3°F | Ht 69.0 in | Wt 172.0 lb

## 2021-10-20 DIAGNOSIS — K4020 Bilateral inguinal hernia, without obstruction or gangrene, not specified as recurrent: Secondary | ICD-10-CM | POA: Diagnosis not present

## 2021-10-20 NOTE — Progress Notes (Signed)
10/20/2021  Reason for Visit: Left inguinal hernia  Requesting Provider: Webb Silversmith, NP  History of Present Illness: Jeremy Turner is a 56 y.o. male presenting for evaluation of a left inguinal hernia.  The patient reports that he noticed this about 3 months ago while he was lifting a dirt bag while doing yard work at home.  She felt a ripping sensation and subsequently started feeling a bulging sensation in the left groin.  He reports over the last 3 months, this has been progressively getting more bothersome with now some symptoms of shooting pain going towards his left scrotum.  Denies any nausea or vomiting or difficulty voiding or constipation.  Denies any pain in the right groin or the at the umbilicus.  He is on disability but his heavy lifting involves doing yard work at home.  Pain otherwise does not radiate beyond the left groin or scrotum area.  He is always able to reduce the bulging itself.  Of note, the patient has a history of atrial fibrillation and is currently on Eliquis as well as COPD and is currently on Trelegy Ellipta.  Ms. Garnette Gunner manages both of these medical conditions.  Past Medical History: Past Medical History:  Diagnosis Date   A-fib ([place])    COPD (chronic obstructive pulmonary disease) ([place])    HLD (hyperlipidemia)    Stenosis of lateral recess of lumbar spine      Past Surgical History: Past Surgical History:  Procedure Laterality Date   LAMINECTOMY  09/2020   L 3, 4, 5   PATELLA FRACTURE SURGERY Left    1990's    Home Medications: Prior to Admission medications   Medication Sig Start Date End Date Taking? Authorizing Provider  albuterol (PROVENTIL) (2.5 MG/3ML) 0.083% nebulizer solution SMARTSIG:1 Vial(s) Via Nebulizer 4 Times Daily PRN 01/25/21  Yes [provider]  albuterol (VENTOLIN HFA) 108 (90 Base) MCG/ACT inhaler SMARTSIG:2 Puff(s) Via Inhaler 3 Times Daily PRN 05/22/21  Yes [provider]  diclofenac Sodium (VOLTAREN)  1 % GEL Apply 2 g topically 4 (four) times daily. 01/25/21  Yes [provider]  ELIQUIS 5 MG TABS tablet TAKE 1 TABLET BY MOUTH TWICE A DAY AS DIRECTED 10/05/21  Yes Baity, Coralie Keens, NP  metoprolol succinate (TOPROL-XL) 25 MG 24 hr tablet TAKE 1 TABLET BY MOUTH TWICE A DAY 09/18/21  Yes Baity, Coralie Keens, NP  traMADol (ULTRAM) 50 MG tablet TAKE 1 TABLET BY MOUTH 2 TIMES DAILY AS NEEDED. 10/04/21  Yes Jearld Fenton, NP  TRELEGY ELLIPTA 100-62.5-25 MCG/ACT AEPB TAKE 1 PUFF BY MOUTH EVERY DAY 08/29/21  Yes Jearld Fenton, NP    Allergies: No Known Allergies  Social History:  reports that he has quit smoking. His smoking use included cigarettes. He smoked an average of .25 packs per day. He has never used smokeless tobacco. He reports that he does not currently use alcohol. He reports that he does not currently use drugs.   Family History: Family History  Problem Relation Age of Onset   Heart disease Father    Healthy Sister    Healthy Brother    Colon cancer Neg Hx    Prostate cancer Neg Hx     Review of Systems: Review of Systems  Constitutional:  Negative for chills and fever.  HENT:  Negative for hearing loss.   Respiratory:  Negative for shortness of breath.   Cardiovascular:  Negative for chest pain.  Gastrointestinal:  Positive for abdominal pain. Negative for diarrhea, nausea  and vomiting.  Genitourinary:  Negative for dysuria.  Musculoskeletal:  Negative for myalgias.  Skin:  Negative for rash.  Neurological:  Negative for dizziness.  Psychiatric/Behavioral:  Negative for depression.     Physical Exam BP 135/77   Pulse 75   Temp 98.3 F (36.8 C) (Oral)   Ht 5' 9" (1.753 m)   Wt 172 lb (78 kg)   SpO2 97%   BMI 25.40 kg/m  CONSTITUTIONAL: No acute distress, well-nourished HEENT:  Normocephalic, atraumatic, extraocular motion intact. NECK: Trachea is midline, and there is no jugular venous distension.  RESPIRATORY:  Lungs are clear, and breath sounds are equal  bilaterally. Normal respiratory effort without pathologic use of accessory muscles. CARDIOVASCULAR: Heart is regular without murmurs, gallops, or rubs. GI: The abdomen is soft, nondistended, nontender to palpation.  The patient has a reducible left inguinal hernia which is limited to the inguinal region without extension into the scrotum.  The bulging itself measures about 3.5 cm.  The patient also has on exam as much smaller reducible right inguinal hernia which is subtle at times.  There is no hernia at the umbilicus.  MUSCULOSKELETAL:  Normal muscle strength and tone in all four extremities.  No peripheral edema or cyanosis. SKIN: Skin turgor is normal. There are no pathologic skin lesions.  NEUROLOGIC:  Motor and sensation is grossly normal.  Cranial nerves are grossly intact. PSYCH:  Alert and oriented to person, place and time. Affect is normal.  Laboratory Analysis: No results found for this or any previous visit (from the past 24 hour(s)).  Imaging: No results found.  Assessment and Plan: This is a 56 y.o. male with bilateral inguinal hernia.  - Discussed with the patient that on exam he indeed has a right inguinal hernia as well as a left inguinal hernia.  However the right inguinal hernia is much smaller and more subtle compared to the left.  Both are easily reducible.  The patient however is having symptoms on the left side including discomfort with bulging and also shooting pain towards the left scrotum.  Discussed with patient likely this is due to the distribution of nerves in the groin and which was being affected by the bulging itself. - Discussed with patient that given the symptoms, would recommend inguinal hernia repair.  Given that he has hernias on both sides, we will repair both at the same time.  Discussed with him my plan for robotic assisted bilateral inguinal hernia repair, and reviewed the surgery at length with him including the risks of bleeding, infection, injury to  surrounding structures, potential for chronic groin pain, the incisions that would be used, that this is an outpatient surgery, postoperative activity restrictions, pain control, and he is willing to proceed. - We will schedule patient for 11/14/2020.  We will send medical clearance to Ms. Baity so that we can stop his Eliquis.  Discussed with him that last dose of Eliquis will be on 11/11/2020. - All of his questions have been answered.   I spent 55 minutes dedicated to the care of this patient on the date of this encounter to include pre-visit review of records, face-to-face time with the patient discussing diagnosis and management, and any post-visit coordination of care.   Rainer Mounce Luis Caree Wolpert, MD [facility]    

## 2021-10-20 NOTE — H&P (View-Only) (Signed)
10/20/2021  Reason for Visit: Left inguinal hernia  Requesting Provider: Webb Silversmith, NP  History of Present Illness: Jeremy Turner is a 56 y.o. male presenting for evaluation of a left inguinal hernia.  The patient reports that he noticed this about 3 months ago while he was lifting a dirt bag while doing yard work at home.  She felt a ripping sensation and subsequently started feeling a bulging sensation in the left groin.  He reports over the last 3 months, this has been progressively getting more bothersome with now some symptoms of shooting pain going towards his left scrotum.  Denies any nausea or vomiting or difficulty voiding or constipation.  Denies any pain in the right groin or the at the umbilicus.  He is on disability but his heavy lifting involves doing yard work at home.  Pain otherwise does not radiate beyond the left groin or scrotum area.  He is always able to reduce the bulging itself.  Of note, the patient has a history of atrial fibrillation and is currently on Eliquis as well as COPD and is currently on Trelegy Ellipta.  Ms. Garnette Gunner manages both of these medical conditions.  Past Medical History: Past Medical History:  Diagnosis Date   A-fib ([place])    COPD (chronic obstructive pulmonary disease) ([place])    HLD (hyperlipidemia)    Stenosis of lateral recess of lumbar spine      Past Surgical History: Past Surgical History:  Procedure Laterality Date   LAMINECTOMY  09/2020   L 3, 4, 5   PATELLA FRACTURE SURGERY Left    1990's    Home Medications: Prior to Admission medications   Medication Sig Start Date End Date Taking? Authorizing Provider  albuterol (PROVENTIL) (2.5 MG/3ML) 0.083% nebulizer solution SMARTSIG:1 Vial(s) Via Nebulizer 4 Times Daily PRN 01/25/21  Yes [provider]  albuterol (VENTOLIN HFA) 108 (90 Base) MCG/ACT inhaler SMARTSIG:2 Puff(s) Via Inhaler 3 Times Daily PRN 05/22/21  Yes [provider]  diclofenac Sodium (VOLTAREN)  1 % GEL Apply 2 g topically 4 (four) times daily. 01/25/21  Yes [provider]  ELIQUIS 5 MG TABS tablet TAKE 1 TABLET BY MOUTH TWICE A DAY AS DIRECTED 10/05/21  Yes Baity, Coralie Keens, NP  metoprolol succinate (TOPROL-XL) 25 MG 24 hr tablet TAKE 1 TABLET BY MOUTH TWICE A DAY 09/18/21  Yes Baity, Coralie Keens, NP  traMADol (ULTRAM) 50 MG tablet TAKE 1 TABLET BY MOUTH 2 TIMES DAILY AS NEEDED. 10/04/21  Yes Jearld Fenton, NP  TRELEGY ELLIPTA 100-62.5-25 MCG/ACT AEPB TAKE 1 PUFF BY MOUTH EVERY DAY 08/29/21  Yes Jearld Fenton, NP    Allergies: No Known Allergies  Social History:  reports that he has quit smoking. His smoking use included cigarettes. He smoked an average of .25 packs per day. He has never used smokeless tobacco. He reports that he does not currently use alcohol. He reports that he does not currently use drugs.   Family History: Family History  Problem Relation Age of Onset   Heart disease Father    Healthy Sister    Healthy Brother    Colon cancer Neg Hx    Prostate cancer Neg Hx     Review of Systems: Review of Systems  Constitutional:  Negative for chills and fever.  HENT:  Negative for hearing loss.   Respiratory:  Negative for shortness of breath.   Cardiovascular:  Negative for chest pain.  Gastrointestinal:  Positive for abdominal pain. Negative for diarrhea, nausea  and vomiting.  Genitourinary:  Negative for dysuria.  Musculoskeletal:  Negative for myalgias.  Skin:  Negative for rash.  Neurological:  Negative for dizziness.  Psychiatric/Behavioral:  Negative for depression.     Physical Exam BP 135/77   Pulse 75   Temp 98.3 F (36.8 C) (Oral)   Ht 5\' 9"  (1.753 m)   Wt 172 lb (78 kg)   SpO2 97%   BMI 25.40 kg/m  CONSTITUTIONAL: No acute distress, well-nourished HEENT:  Normocephalic, atraumatic, extraocular motion intact. NECK: Trachea is midline, and there is no jugular venous distension.  RESPIRATORY:  Lungs are clear, and breath sounds are equal  bilaterally. Normal respiratory effort without pathologic use of accessory muscles. CARDIOVASCULAR: Heart is regular without murmurs, gallops, or rubs. GI: The abdomen is soft, nondistended, nontender to palpation.  The patient has a reducible left inguinal hernia which is limited to the inguinal region without extension into the scrotum.  The bulging itself measures about 3.5 cm.  The patient also has on exam as much smaller reducible right inguinal hernia which is subtle at times.  There is no hernia at the umbilicus.  MUSCULOSKELETAL:  Normal muscle strength and tone in all four extremities.  No peripheral edema or cyanosis. SKIN: Skin turgor is normal. There are no pathologic skin lesions.  NEUROLOGIC:  Motor and sensation is grossly normal.  Cranial nerves are grossly intact. PSYCH:  Alert and oriented to person, place and time. Affect is normal.  Laboratory Analysis: No results found for this or any previous visit (from the past 24 hour(s)).  Imaging: No results found.  Assessment and Plan: This is a 56 y.o. male with bilateral inguinal hernia.  - Discussed with the patient that on exam he indeed has a right inguinal hernia as well as a left inguinal hernia.  However the right inguinal hernia is much smaller and more subtle compared to the left.  Both are easily reducible.  The patient however is having symptoms on the left side including discomfort with bulging and also shooting pain towards the left scrotum.  Discussed with patient likely this is due to the distribution of nerves in the groin and which was being affected by the bulging itself. - Discussed with patient that given the symptoms, would recommend inguinal hernia repair.  Given that he has hernias on both sides, we will repair both at the same time.  Discussed with him my plan for robotic assisted bilateral inguinal hernia repair, and reviewed the surgery at length with him including the risks of bleeding, infection, injury to  surrounding structures, potential for chronic groin pain, the incisions that would be used, that this is an outpatient surgery, postoperative activity restrictions, pain control, and he is willing to proceed. - We will schedule patient for 11/14/2020.  We will send medical clearance to Ms. Baity so that we can stop his Eliquis.  Discussed with him that last dose of Eliquis will be on 11/11/2020. - All of his questions have been answered.   I spent 55 minutes dedicated to the care of this patient on the date of this encounter to include pre-visit review of records, face-to-face time with the patient discussing diagnosis and management, and any post-visit coordination of care.   Melvyn Neth, [facility]

## 2021-10-20 NOTE — Patient Instructions (Addendum)
Last dose of Eliquis 11/11/2021 until further notice.  Our surgery scheduler will call you within 24-48 hours to schedule your surgery.Please have the [facility] surgery sheet available when speaking with her.     Inguinal Hernia, Adult An inguinal hernia develops when fat or the intestines push through a weak spot in a muscle where the leg meets the lower abdomen (groin). This creates a bulge. This kind of hernia could also be: In the scrotum, if you are male. In folds of skin around the vagina, if you are male. There are three types of inguinal hernias: Hernias that can be pushed back into the abdomen (are reducible). This type rarely causes pain. Hernias that are not reducible (are incarcerated). Hernias that are not reducible and lose their blood supply (are strangulated). This type of hernia requires emergency surgery. What are the causes? This condition is caused by having a weak spot in the muscles or tissues in your groin. This develops over time. The hernia may poke through the weak spot when you suddenly strain your lower abdominal muscles, such as when you: Lift a heavy object. Strain to have a bowel movement. Constipation can lead to straining. Cough. What increases the risk? This condition is more likely to develop in: Males. Pregnant females. People who: Are overweight. Work in jobs that require long periods of standing or heavy lifting. Have had an inguinal hernia before. Smoke or have lung disease. These factors can lead to long-term (chronic) coughing. What are the signs or symptoms? Symptoms may depend on the size of the hernia. Often, a small inguinal hernia has no symptoms. Symptoms of a larger hernia may include: A bulge in the groin area. This is easier to see when standing. It might not be visible when lying down. Pain or burning in the groin. This may get worse when lifting, straining, or coughing. A dull ache or a feeling of pressure in the groin. An unusual  bulge in the scrotum, in males. Symptoms of a strangulated inguinal hernia may include: A bulge in your groin that is very painful and tender to the touch. A bulge that turns red or purple. Fever, nausea, and vomiting. Inability to have a bowel movement or to pass gas. How is this diagnosed? This condition is diagnosed based on your symptoms, your medical history, and a physical exam. Your health care provider may feel your groin area and ask you to cough. How is this treated? Treatment depends on the size of your hernia and whether you have symptoms. If you do not have symptoms, your health care provider may have you watch your hernia carefully and have you come in for follow-up visits. If your hernia is large or if you have symptoms, you may need surgery to repair the hernia. Follow these instructions at home: Lifestyle Avoid lifting heavy objects. Avoid standing for long periods of time. Do not use any products that contain nicotine or tobacco. These products include cigarettes, chewing tobacco, and vaping devices, such as e-cigarettes. If you need help quitting, ask your health care provider. Maintain a healthy weight. Preventing constipation You may need to take these actions to prevent or treat constipation: Drink enough fluid to keep your urine pale yellow. Take over-the-counter or prescription medicines. Eat foods that are high in fiber, such as beans, whole grains, and fresh fruits and vegetables. Limit foods that are high in fat and processed sugars, such as fried or sweet foods. General instructions You may try to push the hernia back in  place by very gently pressing on it while lying down. Do not try to force the bulge back in if it will not push in easily. Watch your hernia for any changes in shape, size, or color. Get help right away if you notice any changes. Take over-the-counter and prescription medicines only as told by your health care provider. Keep all follow-up visits.  This is important. Contact a health care provider if: You have a fever or chills. You develop new symptoms. Your symptoms get worse. Get help right away if: You have pain in your groin that suddenly gets worse. You have a bulge in your groin that: Suddenly gets bigger and does not get smaller. Becomes red or purple or painful to the touch. You are a man and you have a sudden pain in your scrotum, or the size of your scrotum suddenly changes. You cannot push the hernia back in place by very gently pressing on it when you are lying down. You have nausea or vomiting that does not go away. You have a fast heartbeat. You cannot have a bowel movement or pass gas. These symptoms may represent a serious problem that is an emergency. Do not wait to see if the symptoms will go away. Get medical help right away. Call your local emergency services (911 in the [place] Summary An inguinal hernia develops when fat or the intestines push through a weak spot in a muscle where your leg meets your lower abdomen (groin). This condition is caused by having a weak spot in muscles or tissues in your groin. Symptoms may depend on the size of the hernia, and they may include pain or swelling in your groin. A small inguinal hernia often has no symptoms. Treatment may not be needed if you do not have symptoms. If you have symptoms or a large hernia, you may need surgery to repair the hernia. Avoid lifting heavy objects. Also, avoid standing for long periods of time. This information is not intended to replace advice given to you by your health care provider. Make sure you discuss any questions you have with your health care provider. Document Revised: 09/15/2019 Document Reviewed: 09/15/2019 Elsevier Patient Education  [place].

## 2021-10-20 NOTE — Telephone Encounter (Signed)
Medical Clearance faxed to [facility] at this time.

## 2021-10-24 ENCOUNTER — Telehealth: Payer: Self-pay | Admitting: Surgery

## 2021-10-24 NOTE — Telephone Encounter (Signed)
Patient has been advised of Pre-Admission date/time, and Surgery date at [facility].  Surgery Date: 11/14/21 Preadmission Testing Date: 11/06/21 (phone 8a-1p)  Patient has been made aware to call [phone], between 1-3:00pm the day before surgery, to find out what time to arrive for surgery.

## 2021-10-29 HISTORY — PX: OTHER SURGICAL HISTORY: SHX169

## 2021-10-30 ENCOUNTER — Ambulatory Visit (INDEPENDENT_AMBULATORY_CARE_PROVIDER_SITE_OTHER): Payer: Medicare Other | Admitting: Internal Medicine

## 2021-10-30 ENCOUNTER — Encounter: Payer: Self-pay | Admitting: Internal Medicine

## 2021-10-30 VITALS — BP 126/68 | HR 45 | Temp 97.5°F | Wt 174.0 lb

## 2021-10-30 DIAGNOSIS — R7309 Other abnormal glucose: Secondary | ICD-10-CM

## 2021-10-30 DIAGNOSIS — I498 Other specified cardiac arrhythmias: Secondary | ICD-10-CM

## 2021-10-30 DIAGNOSIS — I4811 Longstanding persistent atrial fibrillation: Secondary | ICD-10-CM | POA: Diagnosis not present

## 2021-10-30 DIAGNOSIS — E782 Mixed hyperlipidemia: Secondary | ICD-10-CM | POA: Diagnosis not present

## 2021-10-30 DIAGNOSIS — Z01818 Encounter for other preprocedural examination: Secondary | ICD-10-CM | POA: Diagnosis not present

## 2021-10-30 NOTE — Progress Notes (Signed)
Subjective:    Patient ID: Jeremy Turner, male    DOB: Jun 21, 1965, 56 y.o.   MRN: [id number]  HPI  Patient presents the clinic today for surgical clearance.  He plans to have a bilateral inguinal hernia repair by Dr. Hampton Abbot on 11/14/2021.  He has a history of A-fib and COPD, on Eliquis.  He denies chronic cough, shortness of breath or chest pain.  Review of Systems     Past Medical History:  Diagnosis Date   A-fib ([place])    COPD (chronic obstructive pulmonary disease) ([facility])    HLD (hyperlipidemia)    Stenosis of lateral recess of lumbar spine     Current Outpatient Medications  Medication Sig Dispense Refill   albuterol (PROVENTIL) (2.5 MG/3ML) 0.083% nebulizer solution SMARTSIG:1 Vial(s) Via Nebulizer 4 Times Daily PRN     albuterol (VENTOLIN HFA) 108 (90 Base) MCG/ACT inhaler SMARTSIG:2 Puff(s) Via Inhaler 3 Times Daily PRN     diclofenac Sodium (VOLTAREN) 1 % GEL Apply 2 g topically 4 (four) times daily.     ELIQUIS 5 MG TABS tablet TAKE 1 TABLET BY MOUTH TWICE A DAY AS DIRECTED 180 tablet 1   metoprolol succinate (TOPROL-XL) 25 MG 24 hr tablet TAKE 1 TABLET BY MOUTH TWICE A DAY 180 tablet 0   traMADol (ULTRAM) 50 MG tablet TAKE 1 TABLET BY MOUTH 2 TIMES DAILY AS NEEDED. 60 tablet 0   TRELEGY ELLIPTA 100-62.5-25 MCG/ACT AEPB TAKE 1 PUFF BY MOUTH EVERY DAY 60 each 2   No current facility-administered medications for this visit.    No Known Allergies  Family History  Problem Relation Age of Onset   Heart disease Father    Healthy Sister    Healthy Brother    Colon cancer Neg Hx    Prostate cancer Neg Hx     Social History   Socioeconomic History   Marital status: Significant Other    Spouse name: Not on file   Number of children: Not on file   Years of education: Not on file   Highest education level: Not on file  Occupational History   Not on file  Tobacco Use   Smoking status: Former    Packs/day: 0.25    Types: Cigarettes   Smokeless tobacco: Never   Vaping Use   Vaping Use: Never used  Substance and Sexual Activity   Alcohol use: Not Currently   Drug use: Not Currently   Sexual activity: Not on file  Other Topics Concern   Not on file  Social History Narrative   Not on file   Social Determinants of Health   Financial Resource Strain: Not on file  Food Insecurity: Not on file  Transportation Needs: Not on file  Physical Activity: Not on file  Stress: Not on file  Social Connections: Not on file  Intimate Partner Violence: Not on file     Constitutional: Denies fever, malaise, fatigue, headache or abrupt weight changes.  HEENT: Denies eye pain, eye redness, ear pain, ringing in the ears, wax buildup, runny nose, nasal congestion, bloody nose, or sore throat. Respiratory: Denies difficulty breathing, shortness of breath, cough or sputum production.   Cardiovascular: Denies chest pain, chest tightness, palpitations or swelling in the hands or feet.  Gastrointestinal: Denies abdominal pain, bloating, constipation, diarrhea or blood in the stool.  GU: Denies urgency, frequency, pain with urination, burning sensation, blood in urine, odor or discharge. Musculoskeletal: Patient reports chronic low back pain, pain in bilateral groins.  Denies decrease  in range of motion, difficulty with gait, or joint swelling.  Skin: Denies redness, rashes, lesions or ulcercations.  Neurological: Denies dizziness, difficulty with memory, difficulty with speech or problems with balance and coordination.  Psych: Denies anxiety, depression, SI/HI.  No other specific complaints in a complete review of systems (except as listed in HPI above).  Objective:   Physical Exam BP 126/68 (BP Location: Left Arm, Patient Position: Sitting, Cuff Size: Normal)   Pulse (!) 45   Temp (!) 97.5 F (36.4 C) (Temporal)   Wt 174 lb (78.9 kg)   SpO2 100%   BMI 25.70 kg/m   Wt Readings from Last 3 Encounters:  10/20/21 172 lb (78 kg)  10/06/21 172 lb 3.2 oz  (78.1 kg)  05/30/21 167 lb (75.8 kg)    General: Appears his stated age, overweight, in NAD. Skin: Warm, dry and intact. HEENT: Head: normal shape and size; Eyes: sclera white, no icterus, conjunctiva pink, PERRLA and EOMs intact;  Cardiovascular: Bradycardic, in bigeminy. S1,S2 noted.  No murmur, rubs or gallops noted. No JVD or BLE edema. No carotid bruits noted. Pulmonary/Chest: Normal effort and positive vesicular breath sounds. No respiratory distress. No wheezes, rales or ronchi noted.  Abdomen: Bilateral direct inguinal hernia's noted Musculoskeletal:  No difficulty with gait.  Neurological: Alert and oriented.        Assessment & Plan:   Surgical Clearance for Bilateral Inguinal Hernia Repair:  Indication for ECG: Surgical clearance Interpretation of ECG: bigeminy with frequent PVC Comparison of ECG: None We will need referral to cardiology for surgical clearance given abnormal EKG We will check CBC, c-Met today, lipid, A1C, appt, PT/INR  RTC in 1 month for your annual exam Webb Silversmith, NP

## 2021-10-30 NOTE — Patient Instructions (Signed)
Electrocardiogram An electrocardiogram (ECG or EKG) is a test to check your heart. The test is simple and safe, and it does not hurt. It may be done: As a part of a physical exam. To check out symptoms like chest pain or a fast or uneven heartbeat (palpitations). To see how certain heart treatments are working. Tell a health care provider about: Any allergies you have. All medicines you are taking. These include vitamins, herbs, eye drops, creams, and over-the-counter medicines. What are the risks? There are no risks. What happens before the test? There is nothing you need to do to prepare. Do not exercise or drink cold water right before the test. These can affect the results. What happens during the test?  You will take off your clothes from the waist up. You will lie on your back. Hair may be removed from your chest, arms, and legs. Sticky patches (electrodes) will be placed on your chest, arms, and legs. Wires (leads) will be attached to the sticky patches and to a machine. You will be asked to relax and lie still while the machine checks your heart. The test may vary among doctors and hospitals. What can I expect after the test? Your test results will be looked at by a doctor. It is up to you to get your test results. Ask how to get your results when they are ready. Summary An electrocardiogram (ECG or EKG) is a test to check your heart. The test is simple and safe, and it does not hurt. There are no risks of having this test. You do not need to prepare for the test. Sticky patches (electrodes) will be placed on your chest, arms, and legs. Wires (leads) will be attached to the sticky patches and to a machine. While you lie still on your back, the machine will check your heart. This information is not intended to replace advice given to you by your health care provider. Make sure you discuss any questions you have with your health care provider. Document Revised: 09/28/2020  Document Reviewed: 09/08/2019 Elsevier Patient Education  [place].

## 2021-10-31 ENCOUNTER — Telehealth: Payer: Self-pay

## 2021-10-31 ENCOUNTER — Encounter: Payer: Self-pay | Admitting: Cardiology

## 2021-10-31 LAB — CBC
HCT: 47.7 % (ref 38.5–50.0)
Hemoglobin: 16.5 g/dL (ref 13.2–17.1)
MCH: 29.6 pg (ref 27.0–33.0)
MCHC: 34.6 g/dL (ref 32.0–36.0)
MCV: 85.6 fL (ref 80.0–100.0)
MPV: 9.5 fL (ref 7.5–12.5)
Platelets: 244 10*3/uL (ref 140–400)
RBC: 5.57 10*6/uL (ref 4.20–5.80)
RDW: 12.3 % (ref 11.0–15.0)
WBC: 6.8 10*3/uL (ref 3.8–10.8)

## 2021-10-31 LAB — COMPLETE METABOLIC PANEL WITH GFR
AG Ratio: 1.8 (calc) (ref 1.0–2.5)
ALT: 20 U/L (ref 9–46)
AST: 18 U/L (ref 10–35)
Albumin: 4.8 g/dL (ref 3.6–5.1)
Alkaline phosphatase (APISO): 63 U/L (ref 35–144)
BUN: 15 mg/dL (ref 7–25)
CO2: 27 mmol/L (ref 20–32)
Calcium: 9.9 mg/dL (ref 8.6–10.3)
Chloride: 105 mmol/L (ref 98–110)
Creat: 0.93 mg/dL (ref 0.70–1.30)
Globulin: 2.7 g/dL (calc) (ref 1.9–3.7)
Glucose, Bld: 99 mg/dL (ref 65–99)
Potassium: 4.8 mmol/L (ref 3.5–5.3)
Sodium: 141 mmol/L (ref 135–146)
Total Bilirubin: 0.5 mg/dL (ref 0.2–1.2)
Total Protein: 7.5 g/dL (ref 6.1–8.1)
eGFR: 96 mL/min/{1.73_m2} (ref >=60)

## 2021-10-31 LAB — LIPID PANEL
Cholesterol: 245 mg/dL — ABNORMAL HIGH (ref <200)
HDL: 57 mg/dL (ref >=40)
LDL Cholesterol (Calc): 158 mg/dL (calc) — ABNORMAL HIGH
Non-HDL Cholesterol (Calc): 188 mg/dL (calc) — ABNORMAL HIGH (ref <130)
Total CHOL/HDL Ratio: 4.3 (calc) (ref <5.0)
Triglycerides: 162 mg/dL — ABNORMAL HIGH (ref <150)

## 2021-10-31 LAB — APTT: aPTT: 28 s (ref 23–32)

## 2021-10-31 LAB — HEMOGLOBIN A1C
Hgb A1c MFr Bld: 5.4 % of total Hgb (ref <5.7)
Mean Plasma Glucose: 108 mg/dL
eAG (mmol/L): 6 mmol/L

## 2021-10-31 LAB — PROTIME-INR
INR: 1
Prothrombin Time: 10.5 s (ref 9.0–11.5)

## 2021-10-31 NOTE — Telephone Encounter (Signed)
Medical clearance received from [facility]- medium risk-recommends further workup for  abnormal cardiology ECG.  Patient has scheduled Cardiology appointment with DR.Kate Sable on 11/02/21. Cardiology clearance faxed .

## 2021-11-01 ENCOUNTER — Telehealth: Payer: Self-pay | Admitting: Cardiology

## 2021-11-01 NOTE — Telephone Encounter (Signed)
Patient has a new patient appointment with Dr. Ralene Bathe on 11/02/2021.  Request forwarded to provider to address at upcoming visit.

## 2021-11-01 NOTE — Telephone Encounter (Signed)
   Pre-operative Risk Assessment    Patient Name: Jeremy Turner  DOB: February 27, 1965 MRN: [id number]      Request for Surgical Clearance    Procedure:  Lfet Inguinal hernia repair  Date of Surgery:  Clearance 11/15/21                                 Surgeon:  Dr. Olean Ree Surgeon's Group or Practice Name:  [facility] Phone number:  [phone] Fax number:  [phone]   Type of Clearance Requested:   - Medical    Type of Anesthesia:  Not listed   Additional requests/questions:    Signed, Pilar A Ham   11/01/2021, 9:51 AM

## 2021-11-02 ENCOUNTER — Ambulatory Visit: Payer: Medicare Other | Attending: Cardiology | Admitting: Cardiology

## 2021-11-02 ENCOUNTER — Encounter: Payer: Self-pay | Admitting: Cardiology

## 2021-11-02 VITALS — BP 130/80 | HR 88 | Ht 69.0 in | Wt 176.2 lb

## 2021-11-02 DIAGNOSIS — F17200 Nicotine dependence, unspecified, uncomplicated: Secondary | ICD-10-CM | POA: Diagnosis not present

## 2021-11-02 DIAGNOSIS — E782 Mixed hyperlipidemia: Secondary | ICD-10-CM

## 2021-11-02 DIAGNOSIS — I2510 Atherosclerotic heart disease of native coronary artery without angina pectoris: Secondary | ICD-10-CM | POA: Diagnosis not present

## 2021-11-02 DIAGNOSIS — Z01818 Encounter for other preprocedural examination: Secondary | ICD-10-CM | POA: Diagnosis not present

## 2021-11-02 DIAGNOSIS — I4891 Unspecified atrial fibrillation: Secondary | ICD-10-CM

## 2021-11-02 MED ORDER — ATORVASTATIN CALCIUM 20 MG PO TABS: 20.0000 mg | ORAL_TABLET | Freq: Every day | ORAL | 5 refills | Status: DC

## 2021-11-02 NOTE — Progress Notes (Signed)
Cardiology Office Note:    Date:  11/02/2021   ID:  Bedelia Person, DOB 1965/09/14, MRN [id number]  PCP:  Jearld Fenton, NP   [place] [place] [facility] Cardiologist:  Kate Sable, MD     Referring MD: Jearld Fenton, NP   Chief Complaint  Patient presents with   New Patient (Initial Visit)    Afib, PreOp clearance    History of Present Illness:    White Engelberg is a 56 y.o. 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  Moved to the area from [place]. Patient has inguinal hernia on the left side, surgical repair is being planned in 2 weeks.  -Echocardiogram due to heart murmur, echocardiogram 08/2020 showed normal systolic function, EF [id number] apparently had a stress test back in 2021 which was abnormal, underwent left heart cath 03/2019 showing 30% left circumflex disease.  Has a history of paroxysmal atrial fibrillation, takes Toprol-XL and Eliquis for this.  Denies any bleeding issues with Eliquis.  He still smokes, is working on quitting.  Supposed to be on Lipitor, not currently taking.  Past Medical History:  Diagnosis Date   A-fib ([place])    COPD (chronic obstructive pulmonary disease) (HCC)    HLD (hyperlipidemia)    Stenosis of lateral recess of lumbar spine     Past Surgical History:  Procedure Laterality Date   LAMINECTOMY  09/2020   L 3, 4, 5   PATELLA FRACTURE SURGERY Left    1990's    Current Medications: Current Meds  Medication Sig   albuterol (PROVENTIL) (2.5 MG/3ML) 0.083% nebulizer solution SMARTSIG:1 Vial(s) Via Nebulizer 4 Times Daily PRN   albuterol (VENTOLIN HFA) 108 (90 Base) MCG/ACT inhaler SMARTSIG:2 Puff(s) Via Inhaler 3 Times Daily PRN   atorvastatin (LIPITOR) 20 MG tablet Take 1 tablet (20 mg total) by mouth daily.   diclofenac Sodium (VOLTAREN) 1 % GEL Apply 2 g topically 4  (four) times daily.   ELIQUIS 5 MG TABS tablet TAKE 1 TABLET BY MOUTH TWICE A DAY AS DIRECTED   metoprolol succinate (TOPROL-XL) 25 MG 24 hr tablet TAKE 1 TABLET BY MOUTH TWICE A DAY   traMADol (ULTRAM) 50 MG tablet TAKE 1 TABLET BY MOUTH 2 TIMES DAILY AS NEEDED.   TRELEGY ELLIPTA 100-62.5-25 MCG/ACT AEPB TAKE 1 PUFF BY MOUTH EVERY DAY     Allergies:   Patient has no known allergies.   Social History   Socioeconomic History   Marital status: Significant Other    Spouse name: Not on file   Number of children: Not on file   Years of education: Not on file   Highest education level: Not on file  Occupational History   Not on file  Tobacco Use   Smoking status: Every Day    Packs/day: 0.10    Types: Cigarettes   Smokeless tobacco: Never   Tobacco comments:    3-4 cigarettes per day current 11-02-21  Vaping Use   Vaping Use: Never used  Substance and Sexual Activity   Alcohol use: Not Currently   Drug use: Not Currently    Types: Marijuana    Comment: daily   Sexual activity: Not on file  Other Topics Concern   Not on file  Social History Narrative   Not on file   Social Determinants of Health   Financial Resource Strain: Not on file  Food Insecurity:  Not on file  Transportation Needs: Not on file  Physical Activity: Not on file  Stress: Not on file  Social Connections: Not on file     Family History: The patient's family history includes Healthy in his brother and sister; Heart disease in his father. There is no history of Colon cancer or Prostate cancer.  ROS:   Please see the history of present illness.     All other systems reviewed and are negative.  EKGs/Labs/Other Studies Reviewed:    The following studies were reviewed today:   EKG:  EKG is  ordered today.  The ekg ordered today demonstrates sinus rhythm, frequent PVCs.  Recent Labs: 10/30/2021: ALT 20; BUN 15; Creat 0.93; Hemoglobin 16.5; Platelets 244; Potassium 4.8; Sodium 141  Recent Lipid Panel     Component Value Date/Time   CHOL 245 (H) 10/30/2021 0954   TRIG 162 (H) 10/30/2021 0954   HDL 57 10/30/2021 0954   CHOLHDL 4.3 10/30/2021 0954   LDLCALC 158 (H) 10/30/2021 0954     Risk Assessment/Calculations:             Physical Exam:    VS:  BP 130/80 (BP Location: Right Arm, Patient Position: Sitting, Cuff Size: Normal)   Pulse 88   Ht 5\' 9"  (1.753 m)   Wt 176 lb 3.2 oz (79.9 kg)   SpO2 98%   BMI 26.02 kg/m     Wt Readings from Last 3 Encounters:  11/02/21 176 lb 3.2 oz (79.9 kg)  10/30/21 174 lb (78.9 kg)  10/20/21 172 lb (78 kg)     GEN:  Well nourished, well developed in no acute distress HEENT: Normal NECK: No JVD; No carotid bruits CARDIAC: RRR, no murmurs, rubs, gallops RESPIRATORY:  Clear to auscultation without rales, wheezing or rhonchi  ABDOMEN: Soft, non-tender, non-distended MUSCULOSKELETAL:  No edema; No deformity  SKIN: Warm and dry NEUROLOGIC:  Alert and oriented x 3 PSYCHIATRIC:  Normal affect   ASSESSMENT:    1. Pre-op evaluation   2. Coronary artery disease involving native coronary artery of native heart without angina pectoris   3. Atrial fibrillation, unspecified type ([place])   4. Mixed hyperlipidemia   5. Smoking    PLAN:    In order of problems listed above:  Preop eval, left inguinal hernia, surgical repair being planned.  Last echo was normal, left heart cath showed no obstructive disease, patient denies chest pain or shortness of breath.  Okay to proceed with procedure from a cardiac perspective.  Okay to hold Eliquis 48 hours prior to procedure, restart as soon as possible after. Nonobstructive CAD, 30% left circumflex disease.  Restart Lipitor 20 mg, continue Eliquis. Paroxysmal atrial fibrillation, currently in sinus.  Continue Toprol-XL, Eliquis. Hyperlipidemia, restart Lipitor 20 mg daily. Current smoker, smoking cessation advised.  Follow-up in 3 months.        Medication Adjustments/Labs and Tests  Ordered: Current medicines are reviewed at length with the patient today.  Concerns regarding medicines are outlined above.  Orders Placed This Encounter  Procedures   EKG 12-Lead   ECHOCARDIOGRAM COMPLETE   Meds ordered this encounter  Medications   atorvastatin (LIPITOR) 20 MG tablet    Sig: Take 1 tablet (20 mg total) by mouth daily.    Dispense:  30 tablet    Refill:  5    Patient Instructions  Medication Instructions:   Your physician has recommended you make the following change in your medication:    START taking Atorvastatin (Lipitor)  20 MG once a day.   *If you need a refill on your cardiac medications before your next appointment, please call your pharmacy*   Testing/Procedures:  Your physician has requested that you have an echocardiogram 2 months. Echocardiography is a painless test that uses sound waves to create images of your heart. It provides your doctor with information about the size and shape of your heart and how well your heart's chambers and valves are working. This procedure takes approximately one hour. There are no restrictions for this procedure.    Follow-Up: At [facility], you and your health needs are our priority.  As part of our continuing mission to provide you with exceptional heart care, we have created designated Provider Care Teams.  These Care Teams include your primary Cardiologist (physician) and Advanced Practice Providers (APPs -  Physician Assistants and Nurse Practitioners) who all work together to provide you with the care you need, when you need it.  We recommend signing up for the patient portal called "MyChart".  Sign up information is provided on this After Visit Summary.  MyChart is used to connect with patients for Virtual Visits (Telemedicine).  Patients are able to view lab/test results, encounter notes, upcoming appointments, etc.  Non-urgent messages can be sent to your provider as well.   To learn more about what  you can do with MyChart, go to [email or web address].    Your next appointment:   3 month(s)  The format for your next appointment:   In Person  Provider:   You may see Kate Sable, MD or one of the following Advanced Practice Providers on your designated Care Team:   Murray Hodgkins, NP Christell Faith, PA-C Cadence Kathlen Mody, PA-C Gerrie Nordmann, NP    Other Instructions   Important Information About Sugar         Signed, Kate Sable, MD  11/02/2021 1:42 PM    [place]

## 2021-11-02 NOTE — Patient Instructions (Signed)
Medication Instructions:   Your physician has recommended you make the following change in your medication:    START taking Atorvastatin (Lipitor) 20 MG once a day.   *If you need a refill on your cardiac medications before your next appointment, please call your pharmacy*   Testing/Procedures:  Your physician has requested that you have an echocardiogram 2 months. Echocardiography is a painless test that uses sound waves to create images of your heart. It provides your doctor with information about the size and shape of your heart and how well your heart's chambers and valves are working. This procedure takes approximately one hour. There are no restrictions for this procedure.    Follow-Up: At [facility], you and your health needs are our priority.  As part of our continuing mission to provide you with exceptional heart care, we have created designated Provider Care Teams.  These Care Teams include your primary Cardiologist (physician) and Advanced Practice Providers (APPs -  Physician Assistants and Nurse Practitioners) who all work together to provide you with the care you need, when you need it.  We recommend signing up for the patient portal called "MyChart".  Sign up information is provided on this After Visit Summary.  MyChart is used to connect with patients for Virtual Visits (Telemedicine).  Patients are able to view lab/test results, encounter notes, upcoming appointments, etc.  Non-urgent messages can be sent to your provider as well.   To learn more about what you can do with MyChart, go to [email or web address].    Your next appointment:   3 month(s)  The format for your next appointment:   In Person  Provider:   You may see Kate Sable, MD or one of the following Advanced Practice Providers on your designated Care Team:   Murray Hodgkins, NP Christell Faith, PA-C Cadence Kathlen Mody, PA-C Gerrie Nordmann, NP    Other Instructions   Important Information  About Sugar

## 2021-11-06 ENCOUNTER — Other Ambulatory Visit: Payer: Self-pay | Admitting: Internal Medicine

## 2021-11-06 ENCOUNTER — Encounter
Admission: RE | Admit: 2021-11-06 | Discharge: 2021-11-06 | Disposition: A | Discharge status: Home / Self Care | Payer: Medicare Other | Source: Ambulatory Visit | Attending: Surgery | Admitting: Surgery

## 2021-11-06 NOTE — Patient Instructions (Addendum)
Your procedure is scheduled on: Tuesday, October 17 Report to the Registration Desk on the 1st floor of the [facility]. To find out your arrival time, please call [phone] between 1PM - 3PM on: Monday, October 16 If your arrival time is 6:00 am, do not arrive prior to that time as the [place] entrance doors do not open until 6:00 am.  REMEMBER: Instructions that are not followed completely may result in serious medical risk, up to and including death; or upon the discretion of your surgeon and anesthesiologist your surgery may need to be rescheduled.  Do not eat food after midnight the night before surgery.  No gum chewing, lozengers or hard candies.  You may however, drink CLEAR liquids up to 2 hours before you are scheduled to arrive for your surgery. Do not drink anything within 2 hours of your scheduled arrival time.  Clear liquids include: - water  - apple juice without pulp - gatorade (not RED colors) - black coffee or tea (Do NOT add milk or creamers to the coffee or tea) Do NOT drink anything that is not on this list.  TAKE THESE MEDICATIONS THE MORNING OF SURGERY WITH A SIP OF WATER:  Albuterol nebulizer Atorvastatin Metoprolol  Bring your albuterol inhaler with you to the hospital.  Eliquis - last day to TAKE is Saturday, October 14. Continue AFTER surgery per surgeon instructions.  One week prior to surgery: starting October 10 Stop Anti-inflammatories (NSAIDS) such as Advil, Aleve, Ibuprofen, Motrin, Naproxen, Naprosyn and Aspirin based products such as Excedrin, Goodys Powder, BC Powder. Stop ANY OVER THE COUNTER supplements until after surgery. You may however, continue to take Tylenol if needed for pain up until the day of surgery.  No Alcohol for 24 hours before or after surgery.  No Smoking including e-cigarettes for 24 hours prior to surgery.  No chewable tobacco products for at least 6 hours prior to surgery.  No nicotine patches on the day of  surgery.  Do not use any "recreational" drugs for at least a week prior to your surgery.  Please be advised that the combination of cocaine and anesthesia may have negative outcomes, up to and including death. If you test positive for cocaine, your surgery will be cancelled.  On the morning of surgery brush your teeth with toothpaste and water, you may rinse your mouth with mouthwash if you wish. Do not swallow any toothpaste or mouthwash.  Use CHG Soap as directed on instruction sheet.  Do not wear jewelry, make-up, hairpins, clips or nail polish.  Do not wear lotions, powders, or perfumes.   Do not shave body from the neck down 48 hours prior to surgery just in case you cut yourself which could leave a site for infection.  Also, freshly shaved skin may become irritated if using the CHG soap.  Contact lenses, hearing aids and dentures may not be worn into surgery.  Do not bring valuables to the hospital. [facility] is not responsible for any missing/lost belongings or valuables.   Notify your doctor if there is any change in your medical condition (cold, fever, infection).  Wear comfortable clothing (specific to your surgery type) to the hospital.  After surgery, you can help prevent lung complications by doing breathing exercises.  Take deep breaths and cough every 1-2 hours. Your doctor may order a device called an Incentive Spirometer to help you take deep breaths. When coughing or sneezing, hold a pillow firmly against your incision with both hands. This  is called "splinting." Doing this helps protect your incision. It also decreases belly discomfort.  If you are being discharged the day of surgery, you will not be allowed to drive home. You will need a responsible adult (18 years or older) to drive you home and stay with you that night.   If you are taking public transportation, you will need to have a responsible adult (18 years or older) with you. Please confirm with your  physician that it is acceptable to use public transportation.   Please call the [facility] at [phone] if you have any questions about these instructions.  Surgery Visitation Policy:  Patients undergoing a surgery or procedure may have two family members or support persons with them as long as the person is not COVID-19 positive or experiencing its symptoms.      Preparing for Surgery with CHLORHEXIDINE GLUCONATE (CHG) Soap  Chlorhexidine Gluconate (CHG) Soap  o An antiseptic cleaner that kills germs and bonds with the skin to continue killing germs even after washing  o Used for showering the night before surgery and morning of surgery  Before surgery, you can play an important role by reducing the number of germs on your skin.  CHG (Chlorhexidine gluconate) soap is an antiseptic cleanser which kills germs and bonds with the skin to continue killing germs even after washing.  Please do not use if you have an allergy to CHG or antibacterial soaps. If your skin becomes reddened/irritated stop using the CHG.  1. Shower the NIGHT BEFORE SURGERY and the MORNING OF SURGERY with CHG soap.  2. If you choose to wash your hair, wash your hair first as usual with your normal shampoo.  3. After shampooing, rinse your hair and body thoroughly to remove the shampoo.  4. Use CHG as you would any other liquid soap. You can apply CHG directly to the skin and wash gently with a scrungie or a clean washcloth.  5. Apply the CHG soap to your body only from the neck down. Do not use on open wounds or open sores. Avoid contact with your eyes, ears, mouth, and genitals (private parts). Wash face and genitals (private parts) with your normal soap.  6. Wash thoroughly, paying special attention to the area where your surgery will be performed.  7. Thoroughly rinse your body with warm water.  8. Do not shower/wash with your normal soap after using and rinsing off the CHG  soap.  9. Pat yourself dry with a clean towel.  10. Wear clean pajamas to bed the night before surgery.  12. Place clean sheets on your bed the night of your first shower and do not sleep with pets.  13. Shower again with the CHG soap on the day of surgery prior to arriving at the hospital.  14. Do not apply any deodorants/lotions/powders.  15. Please wear clean clothes to the hospital.

## 2021-11-07 NOTE — Telephone Encounter (Signed)
Requested medications are due for refill today.  yes  Requested medications are on the active medications list.  yes  Last refill. 10/04/2021 #60 0 rf  Future visit scheduled.   no  Notes to clinic.  Refill not delegated.    Requested Prescriptions  Pending Prescriptions Disp Refills   traMADol (ULTRAM) 50 MG tablet [Pharmacy Med Name: TRAMADOL HCL 50 MG TABLET] 60 tablet 0    Sig: TAKE 1 TABLET BY MOUTH TWICE A DAY AS NEEDED     Not Delegated - Analgesics:  Opioid Agonists Failed - 11/06/2021  6:45 AM      Failed - This refill cannot be delegated      Failed - Urine Drug Screen completed in last 360 days      Passed - Valid encounter within last 3 months    Recent Outpatient Visits           1 week ago Preoperative clearance   [facility] [place], Coralie Keens, NP   1 month ago Direct left inguinal hernia   [place], [place] W, NP   5 months ago Spinal stenosis of lumbar region without neurogenic claudication   [facility] [place], Coralie Keens, NP       Future Appointments             In 2 months [place], Aaron Edelman, MD [place]. [place]

## 2021-11-09 ENCOUNTER — Encounter: Payer: Self-pay | Admitting: Surgery

## 2021-11-09 NOTE — Progress Notes (Signed)
Perioperative Services  Pre-Admission/Anesthesia Testing Clinical Review  Date: 11/10/21  Patient Demographics:  Name: Jeremy Turner DOB:   Oct 20, 1965 MRN:   [id number]  Planned Surgical Procedure(s):    Case: [id number] Date/Time: 11/14/21 0715   Procedure: XI ROBOTIC ASSISTED BILATERAL INGUINAL HERNIA (Bilateral)   Anesthesia type: General   Pre-op diagnosis: bilateral reducible inguinal hernias   Location: [facility] OR ROOM 05 / [place] ORS FOR ANESTHESIA GROUP   Surgeons: Olean Ree, MD   NOTE: Available PAT nursing documentation and vital signs have been reviewed. Clinical nursing staff has updated patient's PMH/PSHx, current medication list, and drug allergies/intolerances to ensure comprehensive history available to assist in medical decision making as it pertains to the aforementioned surgical procedure and anticipated anesthetic course. Extensive review of available clinical information performed. [facility] PMH and PSHx updated with any diagnoses/procedures that  may have been inadvertently omitted during his intake with the pre-admission testing department's nursing staff.  Clinical Discussion:  Jeremy Turner is a 56 y.o. male who is submitted for pre-surgical anesthesia review and clearance prior to him undergoing the above procedure. Patient is a Current Smoker. Pertinent PMH includes: CAD, PAF, aortic atherosclerosis, cardiac murmur, HLD, COPD, lumbar DDD, lumbar stenosis (s/p laminectomy and interbody fusion), BILATERAL inguinal hernia, marijuana use, long-term prescribed opioid use.  Patient is followed by cardiology Garen Lah, MD). He was last seen in the cardiology clinic on 11/02/2021, at which time he was establishing care with a local provider; notes reviewed.  At the time of his clinic visit, patient doing well overall from a cardiovascular perspective.  He denied any episodes of recent chest pain, shortness breath, PND, orthopnea, palpitations, significant  peripheral edema, vertiginous symptoms, or presyncope/syncope.  Patient with a past medical history significant for cardiovascular diagnoses. Of note, complete records regarding patient's cardiovascular history not available for review at time of consult.  Patient transitioning care to providers in [place] from his previous home state of [place].  Information gathered from patient and from the notes from his most recent visit with local cardiologist.  Patient reportedly underwent diagnostic LEFT heart catheterization in 03/2019 following an abnormal stress test.  Study revealed mild nonobstructive coronary artery disease.  Single-vessel involvement noted with a 30% stenosis in the LCx.  Given the nonobstructive nature of his disease, the decision was made to defer intervention opting for medical management.  Last TTE was performed and 08/2020 revealing a normal left ventricular systolic function with an EF of 55%.  Notes did not indicate any significant valvular regurgitation or stenosis.  Patient with an atrial fibrillation diagnosis; CHA2DS2-VASc Score = 1 (vascular disease history). His rate and rhythm are currently being maintained on oral succinate. He is chronically anticoagulated using demand; reported to be compliant with therapy with no evidence or reports of GI bleeding.  Blood pressure reasonably controlled at 130/80 mmHg on currently prescribed beta-blocker (metoprolol succinate) monotherapy. He is on atorvastatin for his HLD diagnosis and further ASCVD prevention.  Patient is not diabetic. Patient does not have an OSAH diagnosis. Functional capacity, as defined by DASI, is documented as being >/= 4 METS.  No changes were made to his medication regimen.  Given transition of care and length of time that has been since last cardiac assessment, the decision was made to pursue repeat TTE; pending scheduling.  Patient to follow-up with outpatient cardiology in 3 months or sooner if  needed.  Jeremy Turner is scheduled for an elective XI ROBOTIC ASSISTED BILATERAL INGUINAL HERNIA on 11/14/2021 with Dr.  Ardath Sax, MD.  Given patient's past medical history significant for cardiovascular diagnoses, presurgical cardiac clearance was sought by the PAT team.  Per cardiology, "last echocardiogram was normal.  Left heart catheterization showed no obstructive disease.  Patient denies chest pain or shortness of breath.  Okay to proceed with procedure from a cardiac perspective".  Cardiology okay with patient proceeding with planned surgical intervention prior to completing ordered noninvasive cardiovascular study (TTE).  In review of his medication reconciliation, it is noted that patient is currently on prescribed daily anticoagulation therapy. He has been instructed on recommendations for holding his apixaban for 2 days prior to his procedure with plans to restart as soon as postoperative bleeding risk felt to be minimized by his attending surgeon. The patient has been instructed that his last dose of his anticoagulant will be on 11/11/2021.  Patient denies previous perioperative complications with anesthesia in the past.  In review his EMR, there are no records available for review pertaining to past procedural/anesthetic courses within the [facility] system.     11/06/2021    9:08 AM 11/02/2021   11:57 AM 11/02/2021   11:50 AM  Vitals with BMI  Height 5' 9"   5' 9"   Weight 172 lbs  176 lbs 3 oz  BMI 34.91  79.15  Systolic  056 979  Diastolic  80 78  Pulse   88    Providers/Specialists:   NOTE: Primary physician provider listed below. Patient may have been seen by APP or partner within same practice.   PROVIDER ROLE / SPECIALTY LAST Delight Stare, MD General Surgery (Surgeon) 10/20/2021  Jearld Fenton, NP Primary Care Provider 10/30/2021  Kate Sable, MD Cardiology 11/02/2021   Allergies:  Patient has no known allergies.  Current Home Medications:    No current facility-administered medications for this encounter.    albuterol (PROVENTIL) (2.5 MG/3ML) 0.083% nebulizer solution   albuterol (VENTOLIN HFA) 108 (90 Base) MCG/ACT inhaler   atorvastatin (LIPITOR) 20 MG tablet   diclofenac Sodium (VOLTAREN) 1 % GEL   ELIQUIS 5 MG TABS tablet   metoprolol succinate (TOPROL-XL) 25 MG 24 hr tablet   traMADol (ULTRAM) 50 MG tablet   TRELEGY ELLIPTA 100-62.5-25 MCG/ACT AEPB   History:   Past Medical History:  Diagnosis Date   Aortic atherosclerosis ([place])    Bilateral inguinal hernia    CAD (coronary artery disease) 03/2019   a.) LHC (done in [place]): 30% LCx disease - med mgmt.   Cardiac murmur    COPD (chronic obstructive pulmonary disease) (HCC)    DDD (degenerative disc disease), lumbar    HLD (hyperlipidemia)    Long term current use of anticoagulant    a.) apixaban   Marijuana use    a.) UDS (+) for [facility] 05/2021   PAF (paroxysmal atrial fibrillation) (HCC)    a.) CHA2DS2VASc = 1 (vascular disease history);  b.) rate/rhythm maintained on oral metoprolol succinate; chronically anticoagulated with apixaban   Stenosis of lateral recess of lumbar spine    a.) s/p laminectomy with L3-L5 posterior and interbody fusion   Uncomplicated opioid use    a.) long term Tramadol Rx'd by PCP; signed OUA on file   Past Surgical History:  Procedure Laterality Date   CARDIAC CATHETERIZATION  03/2019   PATELLA FRACTURE SURGERY Left    1990's   POSTERIOR LUMBAR FUSION  09/2020   Procedure: LAMINECTOMY;  L3-L5 POSTERIOR AND INTERBODY FUSION   Family History  Problem Relation Age of Onset  Heart disease Father    Healthy Sister    Healthy Brother    Colon cancer Neg Hx    Prostate cancer Neg Hx    Social History   Tobacco Use   Smoking status: Every Day    Packs/day: 0.10    Types: Cigarettes   Smokeless tobacco: Never   Tobacco comments:    3-4 cigarettes per day current 11-02-21  Vaping Use   Vaping Use: Never used  Substance Use  Topics   Alcohol use: Not Currently   Drug use: Not Currently    Types: Marijuana    Comment: daily    Pertinent Clinical Results:  LABS: Labs reviewed: Acceptable for surgery.  Component Date Value Ref Range Status   WBC 10/30/2021 6.8  3.8 - 10.8 Thousand/uL Final   RBC 10/30/2021 5.57  4.20 - 5.80 Million/uL Final   Hemoglobin 10/30/2021 16.5  13.2 - 17.1 g/dL Final   HCT 10/30/2021 47.7  38.5 - 50.0 % Final   MCV 10/30/2021 85.6  80.0 - 100.0 fL Final   MCH 10/30/2021 29.6  27.0 - 33.0 pg Final   MCHC 10/30/2021 34.6  32.0 - 36.0 g/dL Final   RDW 10/30/2021 12.3  11.0 - 15.0 % Final   Platelets 10/30/2021 244  140 - 400 Thousand/uL Final   MPV 10/30/2021 9.5  7.5 - 12.5 fL Final   Glucose, Bld 10/30/2021 99  65 - 99 mg/dL Final   BUN 10/30/2021 15  7 - 25 mg/dL Final   Creat 10/30/2021 0.93  0.70 - 1.30 mg/dL Final   eGFR 10/30/2021 96  > OR = 60 mL/min/1.10m Final   Sodium 10/30/2021 141  135 - 146 mmol/L Final   Potassium 10/30/2021 4.8  3.5 - 5.3 mmol/L Final   Chloride 10/30/2021 105  98 - 110 mmol/L Final   CO2 10/30/2021 27  20 - 32 mmol/L Final   Calcium 10/30/2021 9.9  8.6 - 10.3 mg/dL Final   Total Protein 10/30/2021 7.5  6.1 - 8.1 g/dL Final   Albumin 10/30/2021 4.8  3.6 - 5.1 g/dL Final   Globulin 10/30/2021 2.7  1.9 - 3.7 g/dL (calc) Final   AG Ratio 10/30/2021 1.8  1.0 - 2.5 (calc) Final   Total Bilirubin 10/30/2021 0.5  0.2 - 1.2 mg/dL Final   Alkaline phosphatase (APISO) 10/30/2021 63  35 - 144 U/L Final   AST 10/30/2021 18  10 - 35 U/L Final   ALT 10/30/2021 20  9 - 46 U/L Final   Cholesterol 10/30/2021 245 (H)  <200 mg/dL Final   HDL 10/30/2021 57  > OR = 40 mg/dL Final   Triglycerides 10/30/2021 162 (H)  <150 mg/dL Final   LDL Cholesterol (Calc) 10/30/2021 158 (H)  mg/dL (calc) Final   Comment: Reference range: <100 . Desirable range <100 mg/dL for primary prevention;   <70 mg/dL for patients with CHD or diabetic patients  with > or = 2 CHD risk  factors. .Marland KitchenLDL-C is now calculated using the Martin-Hopkins  calculation, which is a validated novel method providing  better accuracy than the Friedewald equation in the  estimation of LDL-C.  MCresenciano Genreet al. JAnnamaria Helling 20349;179(15: 2061-2068  (http://education.[employer].com/faq/FAQ164)    Total CHOL/HDL Ratio 10/30/2021 4.3  <5.0 (calc) Final   Non-HDL Cholesterol (Calc) 10/30/2021 188 (H)  <130 mg/dL (calc) Final   Comment: For patients with diabetes plus 1 major ASCVD risk  factor, treating to a non-HDL-C goal of <100 mg/dL  (LDL-C of <  70 mg/dL) is considered a therapeutic  option.    Hgb A1c MFr Bld 10/30/2021 5.4  <5.7 % of total Hgb Final   Comment: For the purpose of screening for the presence of diabetes: . <5.7%       Consistent with the absence of diabetes 5.7-6.4%    Consistent with increased risk for diabetes             (prediabetes) > or =6.5%  Consistent with diabetes . This assay result is consistent with a decreased risk of diabetes. . Currently, no consensus exists regarding use of hemoglobin A1c for diagnosis of diabetes in children. . According to American Diabetes Association (ADA) guidelines, hemoglobin A1c <7.0% represents optimal control in non-pregnant diabetic patients. Different metrics may apply to specific patient populations.  Standards of Medical Care in Diabetes(ADA).    Mean Plasma Glucose 10/30/2021 108  mg/dL Final   eAG (mmol/L) 10/30/2021 6.0  mmol/L Final   INR 10/30/2021 1.0   Final   Comment: Reference Range                     0.9-1.1 Moderate-intensity Warfarin Therapy 2.0-3.0 Higher-intensity Warfarin Therapy   3.0-4.0  .    Prothrombin Time 10/30/2021 10.5  9.0 - 11.5 sec Final   Comment: For additional information, please refer to http://education.quest[facility].com/faq/FAQ104 (This link is being provided for informational/ educational purposes only.)    aPTT 10/30/2021 28  23 - 32 sec Final   Comment: . This  test has not been validated for monitoring unfractionated heparin therapy. For testing that is validated for this type of therapy, please refer to the Heparin Anti-Xa assay (test code [phone]). . For additional information, please refer to http://education.[facility].com/faq/FAQ159 (This link is being provided for  informational/educational purposes only.)     ECG: Date: 11/02/2021 Time ECG obtained: 1157 AM Rate: 88 bpm Rhythm:  Sinus rhythm with sinus arrhythmia with frequent and consecutive PVCs Axis (leads I and aVF): Normal Intervals: PR 160 ms. QRS 104 ms. QTc 445 ms. ST segment and T wave changes: No evidence of acute ST segment elevation or depression Comparison: No previous tracings available for review and comparison.    IMAGING / PROCEDURES: DG LUMBAR SPINE 2-3 VIEWS performed on 05/22/2021 L3 through L5 posterior and interbody fusion.  Hardware intact. Diffuse multilevel degenerative change.  8 mm anterolisthesis L4 on L5.  No acute bony abnormality identified.  Impression and Plan:  Bronte Kropf has been referred for pre-anesthesia review and clearance prior to him undergoing the planned anesthetic and procedural courses. Available labs, pertinent testing, and imaging results were personally reviewed by me. This patient has been appropriately cleared by cardiology with an overall ACCEPTABLE risk of significant perioperative cardiovascular complications.  Based on clinical review performed today (11/10/21), barring any significant acute changes in the patient's overall condition, it is anticipated that he will be able to proceed with the planned surgical intervention. Any acute changes in clinical condition may necessitate his procedure being postponed and/or cancelled. Patient will meet with anesthesia team (MD and/or CRNA) on the day of his procedure for preoperative evaluation/assessment. Questions regarding anesthetic course will be fielded at that time.    Pre-surgical instructions were reviewed with the patient during his PAT appointment and questions were fielded by PAT clinical staff. Patient was advised that if any questions or concerns arise prior to his procedure then he should return a call to PAT and/or his surgeon's office to discuss.  Honor Loh, MSN, APRN,  FNP-C, CEN [facility]  Peri-operative Services Nurse Practitioner Phone: [phone] Fax: [phone] 11/10/21 10:24 AM  NOTE: This note has been prepared using Dragon dictation software. Despite my best ability to proofread, there is always the potential that unintentional transcriptional errors may still occur from this process.

## 2021-11-14 ENCOUNTER — Ambulatory Visit: Payer: Medicare Other | Admitting: Urgent Care

## 2021-11-14 ENCOUNTER — Other Ambulatory Visit: Payer: Self-pay

## 2021-11-14 ENCOUNTER — Encounter: Payer: Self-pay | Admitting: Surgery

## 2021-11-14 ENCOUNTER — Ambulatory Visit
Admission: RE | Admit: 2021-11-14 | Discharge: 2021-11-14 | Disposition: A | Discharge status: Home / Self Care | Payer: Medicare Other | Attending: Surgery | Admitting: Surgery

## 2021-11-14 ENCOUNTER — Encounter
Admission: RE | Disposition: A | Discharge status: Home / Self Care | Payer: Self-pay | Source: Home / Self Care | Attending: Surgery

## 2021-11-14 DIAGNOSIS — Z79899 Other long term (current) drug therapy: Secondary | ICD-10-CM | POA: Insufficient documentation

## 2021-11-14 DIAGNOSIS — F17210 Nicotine dependence, cigarettes, uncomplicated: Secondary | ICD-10-CM | POA: Insufficient documentation

## 2021-11-14 DIAGNOSIS — I480 Paroxysmal atrial fibrillation: Secondary | ICD-10-CM | POA: Diagnosis not present

## 2021-11-14 DIAGNOSIS — E785 Hyperlipidemia, unspecified: Secondary | ICD-10-CM | POA: Diagnosis not present

## 2021-11-14 DIAGNOSIS — Z79891 Long term (current) use of opiate analgesic: Secondary | ICD-10-CM | POA: Insufficient documentation

## 2021-11-14 DIAGNOSIS — Z7951 Long term (current) use of inhaled steroids: Secondary | ICD-10-CM | POA: Insufficient documentation

## 2021-11-14 DIAGNOSIS — J449 Chronic obstructive pulmonary disease, unspecified: Secondary | ICD-10-CM | POA: Diagnosis not present

## 2021-11-14 DIAGNOSIS — Z7901 Long term (current) use of anticoagulants: Secondary | ICD-10-CM | POA: Insufficient documentation

## 2021-11-14 DIAGNOSIS — F17200 Nicotine dependence, unspecified, uncomplicated: Secondary | ICD-10-CM | POA: Diagnosis not present

## 2021-11-14 DIAGNOSIS — K4020 Bilateral inguinal hernia, without obstruction or gangrene, not specified as recurrent: Secondary | ICD-10-CM | POA: Insufficient documentation

## 2021-11-14 DIAGNOSIS — I2510 Atherosclerotic heart disease of native coronary artery without angina pectoris: Secondary | ICD-10-CM | POA: Diagnosis not present

## 2021-11-14 HISTORY — DX: Cardiac murmur, unspecified: R01.1

## 2021-11-14 HISTORY — DX: Bilateral inguinal hernia, without obstruction or gangrene, not specified as recurrent: K40.20

## 2021-11-14 HISTORY — DX: Cannabis use, unspecified, uncomplicated: F12.90

## 2021-11-14 HISTORY — DX: Atherosclerosis of aorta: I70.0

## 2021-11-14 HISTORY — DX: Other intervertebral disc degeneration, lumbar region: M51.36

## 2021-11-14 HISTORY — DX: Other intervertebral disc degeneration, lumbar region without mention of lumbar back pain or lower extremity pain: M51.369

## 2021-11-14 HISTORY — DX: Opioid use, unspecified, uncomplicated: F11.90

## 2021-11-14 HISTORY — DX: Paroxysmal atrial fibrillation: I48.0

## 2021-11-14 HISTORY — DX: Long term (current) use of anticoagulants: Z79.01

## 2021-11-14 SURGERY — REPAIR, HERNIA, INGUINAL, BILATERAL, ROBOT-ASSISTED
Anesthesia: General | Site: Inguinal | Laterality: Bilateral

## 2021-11-14 MED ORDER — ACETAMINOPHEN 500 MG PO TABS
ORAL_TABLET | ORAL | Status: AC
  Administered 2021-11-14: 1000 mg via ORAL
  Filled 2021-11-14: qty 2

## 2021-11-14 MED ORDER — SEVOFLURANE IN SOLN
RESPIRATORY_TRACT | Status: AC
  Filled 2021-11-14: qty 250

## 2021-11-14 MED ORDER — BUPIVACAINE LIPOSOME 1.3 % IJ SUSP
INTRAMUSCULAR | Status: AC
  Filled 2021-11-14: qty 20

## 2021-11-14 MED ORDER — FENTANYL CITRATE (PF) 100 MCG/2ML IJ SOLN
INTRAMUSCULAR | Status: AC
  Filled 2021-11-14: qty 2

## 2021-11-14 MED ORDER — OXYCODONE HCL 5 MG PO TABS: 5.0000 mg | ORAL_TABLET | ORAL | 0 refills | Status: DC | PRN

## 2021-11-14 MED ORDER — ONDANSETRON HCL 4 MG/2ML IJ SOLN
INTRAMUSCULAR | Status: AC
  Filled 2021-11-14: qty 2

## 2021-11-14 MED ORDER — CHLORHEXIDINE GLUCONATE 0.12 % MT SOLN: 15.0000 mL | Freq: Once | OROMUCOSAL | Status: AC

## 2021-11-14 MED ORDER — OXYCODONE HCL 5 MG PO TABS
5.0000 mg | ORAL_TABLET | Freq: Once | ORAL | Status: AC | PRN
  Administered 2021-11-14: 5 mg via ORAL

## 2021-11-14 MED ORDER — DEXAMETHASONE SODIUM PHOSPHATE 10 MG/ML IJ SOLN
INTRAMUSCULAR | Status: DC | PRN
  Administered 2021-11-14: 10 mg via INTRAVENOUS

## 2021-11-14 MED ORDER — GABAPENTIN 300 MG PO CAPS
ORAL_CAPSULE | ORAL | Status: AC
  Administered 2021-11-14: 300 mg via ORAL
  Filled 2021-11-14: qty 1

## 2021-11-14 MED ORDER — OXYCODONE HCL 5 MG/5ML PO SOLN: 5.0000 mg | Freq: Once | ORAL | Status: AC | PRN

## 2021-11-14 MED ORDER — HYDROMORPHONE HCL 1 MG/ML IJ SOLN: 0.2500 mg | INTRAMUSCULAR | Status: DC | PRN

## 2021-11-14 MED ORDER — ORAL CARE MOUTH RINSE: 15.0000 mL | Freq: Once | OROMUCOSAL | Status: AC

## 2021-11-14 MED ORDER — SODIUM CHLORIDE 0.9 % IR SOLN
Status: DC | PRN
  Administered 2021-11-14: 700 mL

## 2021-11-14 MED ORDER — FENTANYL CITRATE (PF) 100 MCG/2ML IJ SOLN
INTRAMUSCULAR | Status: DC | PRN
  Administered 2021-11-14: 100 ug via INTRAVENOUS
  Administered 2021-11-14: 25 ug via INTRAVENOUS

## 2021-11-14 MED ORDER — SUGAMMADEX SODIUM 200 MG/2ML IV SOLN
INTRAVENOUS | Status: DC | PRN
  Administered 2021-11-14: 200 mg via INTRAVENOUS

## 2021-11-14 MED ORDER — BUPIVACAINE-EPINEPHRINE 0.5% -1:200000 IJ SOLN
INTRAMUSCULAR | Status: DC | PRN
  Administered 2021-11-14: 50 mL

## 2021-11-14 MED ORDER — LACTATED RINGERS IV SOLN: INTRAVENOUS | Status: DC

## 2021-11-14 MED ORDER — CEFAZOLIN SODIUM-DEXTROSE 2-4 GM/100ML-% IV SOLN
INTRAVENOUS | Status: AC
  Filled 2021-11-14: qty 100

## 2021-11-14 MED ORDER — ACETAMINOPHEN 500 MG PO TABS: 1000.0000 mg | ORAL_TABLET | Freq: Four times a day (QID) | ORAL | Status: AC | PRN

## 2021-11-14 MED ORDER — SODIUM CHLORIDE (PF) 0.9 % IJ SOLN
INTRAMUSCULAR | Status: AC
  Filled 2021-11-14: qty 50

## 2021-11-14 MED ORDER — MIDAZOLAM HCL 2 MG/2ML IJ SOLN
INTRAMUSCULAR | Status: AC
  Filled 2021-11-14: qty 2

## 2021-11-14 MED ORDER — OXYCODONE HCL 5 MG PO TABS
ORAL_TABLET | ORAL | Status: AC
  Filled 2021-11-14: qty 1

## 2021-11-14 MED ORDER — CHLORHEXIDINE GLUCONATE CLOTH 2 % EX PADS
6.0000 | MEDICATED_PAD | Freq: Once | CUTANEOUS | Status: AC
  Administered 2021-11-14: 6 via TOPICAL

## 2021-11-14 MED ORDER — CEFAZOLIN SODIUM-DEXTROSE 2-4 GM/100ML-% IV SOLN
2.0000 g | INTRAVENOUS | Status: AC
  Administered 2021-11-14: 2 g via INTRAVENOUS

## 2021-11-14 MED ORDER — BUPIVACAINE-EPINEPHRINE (PF) 0.5% -1:200000 IJ SOLN
INTRAMUSCULAR | Status: AC
  Filled 2021-11-14: qty 30

## 2021-11-14 MED ORDER — PROPOFOL 10 MG/ML IV BOLUS
INTRAVENOUS | Status: DC | PRN
  Administered 2021-11-14: 150 mg via INTRAVENOUS

## 2021-11-14 MED ORDER — FAMOTIDINE 20 MG PO TABS: 20.0000 mg | ORAL_TABLET | Freq: Once | ORAL | Status: AC

## 2021-11-14 MED ORDER — ACETAMINOPHEN 500 MG PO TABS: 1000.0000 mg | ORAL_TABLET | ORAL | Status: AC

## 2021-11-14 MED ORDER — FAMOTIDINE 20 MG PO TABS
ORAL_TABLET | ORAL | Status: AC
  Administered 2021-11-14: 20 mg via ORAL
  Filled 2021-11-14: qty 1

## 2021-11-14 MED ORDER — GLYCOPYRROLATE 0.2 MG/ML IJ SOLN
INTRAMUSCULAR | Status: AC
  Filled 2021-11-14: qty 1

## 2021-11-14 MED ORDER — GABAPENTIN 300 MG PO CAPS: 300.0000 mg | ORAL_CAPSULE | ORAL | Status: AC

## 2021-11-14 MED ORDER — CHLORHEXIDINE GLUCONATE 0.12 % MT SOLN
OROMUCOSAL | Status: AC
  Administered 2021-11-14: 15 mL via OROMUCOSAL
  Filled 2021-11-14: qty 15

## 2021-11-14 MED ORDER — DEXAMETHASONE SODIUM PHOSPHATE 10 MG/ML IJ SOLN
INTRAMUSCULAR | Status: AC
  Filled 2021-11-14: qty 1

## 2021-11-14 MED ORDER — ROCURONIUM BROMIDE 100 MG/10ML IV SOLN
INTRAVENOUS | Status: DC | PRN
  Administered 2021-11-14: 60 mg via INTRAVENOUS
  Administered 2021-11-14: 30 mg via INTRAVENOUS
  Administered 2021-11-14: 10 mg via INTRAVENOUS

## 2021-11-14 MED ORDER — ONDANSETRON HCL 4 MG/2ML IJ SOLN
INTRAMUSCULAR | Status: DC | PRN
  Administered 2021-11-14: 4 mg via INTRAVENOUS

## 2021-11-14 MED ORDER — CHLORHEXIDINE GLUCONATE CLOTH 2 % EX PADS: 6.0000 | MEDICATED_PAD | Freq: Once | CUTANEOUS | Status: DC

## 2021-11-14 MED ORDER — BUPIVACAINE LIPOSOME 1.3 % IJ SUSP: 20.0000 mL | Freq: Once | INTRAMUSCULAR | Status: DC

## 2021-11-14 MED ORDER — MIDAZOLAM HCL 2 MG/2ML IJ SOLN
INTRAMUSCULAR | Status: DC | PRN
  Administered 2021-11-14: 2 mg via INTRAVENOUS

## 2021-11-14 MED ORDER — GLYCOPYRROLATE 0.2 MG/ML IJ SOLN: 0.2000 mg | Freq: Once | INTRAMUSCULAR | Status: DC

## 2021-11-14 MED ORDER — LIDOCAINE HCL (CARDIAC) PF 100 MG/5ML IV SOSY
PREFILLED_SYRINGE | INTRAVENOUS | Status: DC | PRN
  Administered 2021-11-14: 60 mg via INTRAVENOUS

## 2021-11-14 SURGICAL SUPPLY — 53 items
ADH SKN CLS APL DERMABOND .7 (GAUZE/BANDAGES/DRESSINGS) ×1
CANNULA REDUC XI 12-8 STAPL (CANNULA) ×1
CANNULA REDUCER 12-8 DVNC XI (CANNULA) ×1 IMPLANT
COVER TIP SHEARS 8 DVNC (MISCELLANEOUS) ×1 IMPLANT
COVER TIP SHEARS 8MM DA VINCI (MISCELLANEOUS) ×1
COVER WAND RF STERILE (DRAPES) ×1 IMPLANT
DERMABOND ADVANCED .7 DNX12 (GAUZE/BANDAGES/DRESSINGS) ×1 IMPLANT
DRAPE ARM DVNC X/XI (DISPOSABLE) ×3 IMPLANT
DRAPE COLUMN DVNC XI (DISPOSABLE) ×1 IMPLANT
DRAPE DA VINCI XI ARM (DISPOSABLE) ×3
DRAPE DA VINCI XI COLUMN (DISPOSABLE) ×1
ELECT CAUTERY BLADE TIP 2.5 (TIP) ×1
ELECT REM PT RETURN 9FT ADLT (ELECTROSURGICAL) ×1
ELECTRODE CAUTERY BLDE TIP 2.5 (TIP) ×1 IMPLANT
ELECTRODE REM PT RTRN 9FT ADLT (ELECTROSURGICAL) ×1 IMPLANT
GLOVE SURG SYN 7.0 (GLOVE) ×2 IMPLANT
GLOVE SURG SYN 7.0 PF PI (GLOVE) ×2 IMPLANT
GLOVE SURG SYN 7.5  E (GLOVE) ×2
GLOVE SURG SYN 7.5 E (GLOVE) ×2 IMPLANT
GLOVE SURG SYN 7.5 PF PI (GLOVE) ×2 IMPLANT
GOWN STRL REUS W/ TWL LRG LVL3 (GOWN DISPOSABLE) ×4 IMPLANT
GOWN STRL REUS W/TWL LRG LVL3 (GOWN DISPOSABLE) ×4
IRRIGATION STRYKERFLOW (MISCELLANEOUS) ×1 IMPLANT
IRRIGATOR STRYKERFLOW (MISCELLANEOUS) ×1
IV NS 1000ML (IV SOLUTION) ×1
IV NS 1000ML BAXH (IV SOLUTION) IMPLANT
KIT PINK PAD W/HEAD ARE REST (MISCELLANEOUS) ×1
KIT PINK PAD W/HEAD ARM REST (MISCELLANEOUS) ×1 IMPLANT
LABEL OR SOLS (LABEL) ×1 IMPLANT
MANIFOLD NEPTUNE II (INSTRUMENTS) ×1 IMPLANT
MESH 3DMAX MID 4X6 LT LRG (Mesh General) IMPLANT
MESH 3DMAX MID 4X6 RT LRG (Mesh General) IMPLANT
NDL INSUFFLATION 14GA 120MM (NEEDLE) ×1 IMPLANT
NEEDLE HYPO 22GX1.5 SAFETY (NEEDLE) ×1 IMPLANT
NEEDLE INSUFFLATION 14GA 120MM (NEEDLE) ×1 IMPLANT
OBTURATOR OPTICAL STANDARD 8MM (TROCAR) ×1
OBTURATOR OPTICAL STND 8 DVNC (TROCAR) ×1
OBTURATOR OPTICALSTD 8 DVNC (TROCAR) ×1 IMPLANT
PACK LAP CHOLECYSTECTOMY (MISCELLANEOUS) ×1 IMPLANT
PENCIL SMOKE EVACUATOR (MISCELLANEOUS) ×1 IMPLANT
SEAL CANN UNIV 5-8 DVNC XI (MISCELLANEOUS) ×3 IMPLANT
SEAL XI 5MM-8MM UNIVERSAL (MISCELLANEOUS) ×3
SET TUBE SMOKE EVAC HIGH FLOW (TUBING) ×1 IMPLANT
SOLUTION ELECTROLUBE (MISCELLANEOUS) ×1 IMPLANT
SPONGE T-LAP 18X18 ~~LOC~~+RFID (SPONGE) ×1 IMPLANT
STAPLER CANNULA SEAL DVNC XI (STAPLE) ×1 IMPLANT
STAPLER CANNULA SEAL XI (STAPLE) ×1
SUT MNCRL AB 4-0 PS2 18 (SUTURE) ×1 IMPLANT
SUT VIC AB 2-0 SH 27 (SUTURE) ×1
SUT VIC AB 2-0 SH 27XBRD (SUTURE) ×2 IMPLANT
SUT VICRYL 0 AB UR-6 (SUTURE) ×2 IMPLANT
SUT VLOC 90 S/L VL9 GS22 (SUTURE) ×1 IMPLANT
TRAY FOLEY SLVR 16FR LF STAT (SET/KITS/TRAYS/PACK) ×1 IMPLANT

## 2021-11-14 NOTE — Progress Notes (Signed)
Pulses running in the low to mid 30's. Pt states he "lives" in [place]. Paged Dr Danne Baxter. Orders to give Robinol. No c/o chest pain or SOB, just "dizziness". B/P 140's over 80's.

## 2021-11-14 NOTE — Interval H&P Note (Signed)
History and Physical Interval Note:  11/14/2021 9:12 AM  Jeremy Turner  has presented today for surgery, with the diagnosis of bilateral redicible inguinal hernias.  The various methods of treatment have been discussed with the patient and family. After consideration of risks, benefits and other options for treatment, the patient has consented to  Procedure(s): XI ROBOTIC ASSISTED BILATERAL INGUINAL HERNIA (Bilateral) as a surgical intervention.  The patient's history has been reviewed, patient examined, no change in status, stable for surgery.  I have reviewed the patient's chart and labs.  Questions were answered to the patient's satisfaction.     [name]

## 2021-11-14 NOTE — Transfer of Care (Signed)
Immediate Anesthesia Transfer of Care Note  Patient: Jeremy Turner  Procedure(s) Performed: XI ROBOTIC ASSISTED BILATERAL INGUINAL HERNIA (Bilateral: Inguinal)  Patient Location: PACU  Anesthesia Type:General  Level of Consciousness: awake, alert  and oriented  Airway & Oxygen Therapy: Patient Spontanous Breathing  Post-op Assessment: Report given to RN and Post -op Vital signs reviewed and stable  Post vital signs: Reviewed and stable  Last Vitals:  Vitals Value Taken Time  BP 163/90 11/14/21 1247  Temp    Pulse 63 11/14/21 1252  Resp 16 11/14/21 1252  SpO2 100 % 11/14/21 1252  Vitals shown include unvalidated device data.  Last Pain:  Vitals:   11/14/21 0852  TempSrc: Temporal  PainSc: 4          Complications: No notable events documented.

## 2021-11-14 NOTE — Anesthesia Preprocedure Evaluation (Signed)
Anesthesia Evaluation  Patient identified by MRN, date of birth, ID band Patient awake    Reviewed: Allergy & Precautions, NPO status , Patient's Chart, lab work & pertinent test results, reviewed documented beta blocker date and time   History of Anesthesia Complications Negative for: history of anesthetic complications  Airway Mallampati: III  TM Distance: >3 FB Neck ROM: full    Dental  (+) Chipped, Poor Dentition, Missing   Pulmonary COPD,  COPD inhaler, Current Smoker and Patient abstained from smoking.,    Pulmonary exam normal        Cardiovascular (-) angina+ CAD  (-) DOE + dysrhythmias Atrial Fibrillation      Neuro/Psych negative neurological ROS  negative psych ROS   GI/Hepatic negative GI ROS, (+)     substance abuse  marijuana use,   Endo/Other  negative endocrine ROS  Renal/GU      Musculoskeletal  (+) Arthritis , Chronic opriod use    Abdominal   Peds  Hematology negative hematology ROS (+)   Anesthesia Other Findings Past Medical History: No date: Aortic atherosclerosis (HCC) No date: Bilateral inguinal hernia 03/2019: CAD (coronary artery disease)     Comment:  a.) LHC (done in [place]): 30% LCx disease - med mgmt. No date: Cardiac murmur No date: COPD (chronic obstructive pulmonary disease) (HCC) No date: DDD (degenerative disc disease), lumbar No date: HLD (hyperlipidemia) No date: Long term current use of anticoagulant     Comment:  a.) apixaban No date: Marijuana use     Comment:  a.) UDS (+) for [facility] 05/2021 No date: PAF (paroxysmal atrial fibrillation) (HCC)     Comment:  a.) CHA2DS2VASc = 1 (vascular disease history);  b.)               rate/rhythm maintained on oral metoprolol succinate;               chronically anticoagulated with apixaban No date: Stenosis of lateral recess of lumbar spine     Comment:  a.) s/p laminectomy with L3-L5 posterior and interbody                fusion No date: Uncomplicated opioid use     Comment:  a.) long term Tramadol Rx'd by PCP; signed OUA on file  Past Surgical History: 03/2019: CARDIAC CATHETERIZATION No date: PATELLA FRACTURE SURGERY; Left     Comment:  1990's 09/2020: POSTERIOR LUMBAR FUSION     Comment:  Procedure: LAMINECTOMY;  L3-L5 POSTERIOR AND INTERBODY               FUSION  BMI    Body Mass Index: 25.39 kg/m      Reproductive/Obstetrics negative OB ROS                            Anesthesia Physical Anesthesia Plan  ASA: 3  Anesthesia Plan: General ETT   Post-op Pain Management: Tylenol PO (pre-op)*, Gabapentin PO (pre-op)*, Toradol IV (intra-op)* and Dilaudid IV   Induction: Intravenous  PONV Risk Score and Plan: 3 and Ondansetron, Dexamethasone, Midazolam and Treatment may vary due to age or medical condition  Airway Management Planned: Oral ETT  Additional Equipment:   Intra-op Plan:   Post-operative Plan: Extubation in OR  Informed Consent: I have reviewed the patients History and Physical, chart, labs and discussed the procedure including the risks, benefits and alternatives for the proposed anesthesia with the patient or authorized representative who has indicated his/her  understanding and acceptance.     Dental Advisory Given  Plan Discussed with: Anesthesiologist, CRNA and Surgeon  Anesthesia Plan Comments: (Patient consented for risks of anesthesia including but not limited to:  - adverse reactions to medications - damage to eyes, teeth, lips or other oral mucosa - nerve damage due to positioning  - sore throat or hoarseness - Damage to heart, brain, nerves, lungs, other parts of body or loss of life  Patient voiced understanding.)        Anesthesia Quick Evaluation

## 2021-11-14 NOTE — Anesthesia Procedure Notes (Signed)
Procedure Name: Intubation Date/Time: 11/14/2021 9:53 AM  Performed by: Hedda Slade, CRNAPre-anesthesia Checklist: Patient identified, Patient being monitored, Timeout performed, Emergency Drugs available and Suction available Patient Re-evaluated:Patient Re-evaluated prior to induction Oxygen Delivery Method: Circle system utilized Preoxygenation: Pre-oxygenation with 100% oxygen Induction Type: IV induction Ventilation: Mask ventilation without difficulty Laryngoscope Size: 4 and McGraph Grade View: Grade I Tube type: Oral Tube size: 7.0 mm Number of attempts: 1 Airway Equipment and Method: Stylet Placement Confirmation: ETT inserted through vocal cords under direct vision, positive ETCO2 and breath sounds checked- equal and bilateral Secured at: 22 cm Tube secured with: Tape Dental Injury: Teeth and Oropharynx as per pre-operative assessment

## 2021-11-14 NOTE — Discharge Instructions (Signed)
AMBULATORY SURGERY  ?DISCHARGE INSTRUCTIONS ? ? ?The drugs that you were given will stay in your system until tomorrow so for the next 24 hours you should not: ? ?Drive an automobile ?Make any legal decisions ?Drink any alcoholic beverage ? ? ?You may resume regular meals tomorrow.  Today it is better to start with liquids and gradually work up to solid foods. ? ?You may eat anything you prefer, but it is better to start with liquids, then soup and crackers, and gradually work up to solid foods. ? ? ?Please notify your doctor immediately if you have any unusual bleeding, trouble breathing, redness and pain at the surgery site, drainage, fever, or pain not relieved by medication. ? ? ? ?Additional Instructions: ? ? ? ?Please contact your physician with any problems or Same Day Surgery at [phone], Monday through Friday 6 am to 4 pm, or [place] [facility] [place] Main number at [phone].  ?

## 2021-11-14 NOTE — Anesthesia Postprocedure Evaluation (Signed)
Anesthesia Post Note  Patient: Jeremy Turner  Procedure(s) Performed: XI ROBOTIC ASSISTED BILATERAL INGUINAL HERNIA (Bilateral: Inguinal)  Patient location during evaluation: PACU Anesthesia Type: General Level of consciousness: awake and alert Pain management: pain level controlled Vital Signs Assessment: post-procedure vital signs reviewed and stable Respiratory status: spontaneous breathing, nonlabored ventilation, respiratory function stable and patient connected to nasal cannula oxygen Cardiovascular status: blood pressure returned to baseline and stable Postop Assessment: no apparent nausea or vomiting Anesthetic complications: no   No notable events documented.   Last Vitals:  Vitals:   11/14/21 1300 11/14/21 1320  BP: (!) 167/92   Pulse: 69 68  Resp: 17 (!) 9  Temp:    SpO2: 100% 97%    Last Pain:  Vitals:   11/14/21 1320  TempSrc:   PainSc: 4                  Ilene Qua

## 2021-11-14 NOTE — Op Note (Signed)
Procedure Date:  11/14/2021  Pre-operative Diagnosis:  Bilateral inguinal hernia  Post-operative Diagnosis: Bilateral inguinal hernia  Procedure: 1.  Robotic assisted Bilateral Inguinal Hernia Repair 2.  Creation of Bilateral Posterior Rectus-Transversalis Fascia Advancment Flap for Coverage of Pelvic Wound (200 cm)  Surgeon:  Melvyn Neth, MD  Anesthesia:  General endotracheal  Estimated Blood Loss:  30 ml  Specimens:  None  Complications:  None  Indications for Procedure:  This is a 56 y.o. male who presents with a bilateral inguinal hernia.  The options of surgery versus observation were reviewed with the patient and/or family. The risks of bleeding, abscess or infection, recurrence of symptoms, potential for an open procedure, injury to surrounding structures, and chronic pain were all discussed with the patient and he was willing to proceed.  We have planned this transabdominal procedure with the creation of bilateral peritoneal flaps based on the posterior rectus sheath and transversalis fascia in order to fully cover the mesh, creating a natural tisssue barrier for the bowel and peritoneal cavity.  Description of Procedure: The patient was correctly identified in the preoperative area and brought into the operating room.  The patient was placed supine with VTE prophylaxis in place.  Appropriate time-outs were performed.  Anesthesia was induced and the patient was intubated.  Foley catheter was placed.  Appropriate antibiotics were infused.  The abdomen was prepped and draped in a sterile fashion. A supraumbilical incision was made. A cutdown technique was used to enter the abdominal cavity without injury, and a Hasson trocar was inserted.  Pneumoperitoneum was obtained with appropriate opening pressures.  A Veress needle was used to start dissecting the peritoneal flaps.  Two 8-mm robotic ports were placed in the right and left lateral positions under direct visualization.  A  large right and left Bard 3D Max Mid Mesh, a 2-0 Vicryl, and two 2-0 vlock sutures were placed through the umbilical port under direct visualization.  The [employer] platform was docked onto the patient, the camera was inserted and targeted, and the instruments were placed under direct visualization.  Both inguinal regions were inspected for hernias and it was confirmed that the patient had a bilateral inguinal hernia.  We started on the left side which was more symptomatic.  He had omentum and epiploic fat in the hernia sac/defect.  Using electocautery, the peritoneal and posterior rectus tissue flap was created.  The peritoneum on the left side was scored from the median umbilical ligament laterally towards the ASIS.  The flap was mobilized using robotic scissors and the bipolar instruments, creating a plane along the posterior rectus sheath and transversalis fascia down to the pubic tubercle medially. It was then further mobilized laterally across the inguinal canal and femoral vessels and onto the psoas muscle. The inferior epigastric vessels were identified and preserved. This created a posterior rectus and peritoneal flap measuring roughly 17 cm x 12 cm.  The hernia sac and contents were reduced preserving all structures.  A large left Bard 3D Max Mid mesh was placed with good overlap along all the potential hernia defects and secured in place with 2-0 Vicryl along the medial superomedial and superolateral aspects.  We then moved to the right side.  Using electocautery, the peritoneal and posterior rectus tissue flap was created.  The peritoneum on the right side was scored from the median umbilical ligament laterally towards the ASIS.  The flap was mobilized using robotic scissors and the bipolar instruments, creating a plane along the posterior rectus  sheath and transversalis fascia down to the pubic tubercle medially. It was then further mobilized laterally across the inguinal canal and femoral vessels  and onto the psoas muscle. The inferior epigastric vessels were identified and preserved. This created a posterior rectus and peritoneal flap measuring roughly 17 cm x 12 cm.  The hernia sac and contents were reduced preserving all structures.  There was a very small direct hernia.  A large right Bard 3D Max Mid mesh was placed with good overlap along all the potential hernia defects and secured in place with 2-0 Vicryl along the medial superomedial and superolateral aspects.    Then, the peritoneal flaps were advanced over the mesh and carried over to close the defects. A running 2-0 V lock suture was used to approximate the edge of the flap onto the peritoneum on each side.  All needles were removed under direct visualization.  The 8- mm ports were removed under direct visualization and the Hasson trocar was removed.  The fascial opening was closed using 0 vicryl suture.  Local anesthetic was infused in all incisions as well as a bilateral ilioinguinal block, and the incisions were closed with 3-0 Vicryl and 4-0 Monocryl.  The wounds were cleaned and sealed with DermaBond.  Foley catheter was removed and the patient was emerged from anesthesia and extubated and brought to the recovery room for further management.  The patient tolerated the procedure well and all counts were correct at the end of the case.   Howie Ill, MD

## 2021-11-15 ENCOUNTER — Encounter: Payer: Self-pay | Admitting: Surgery

## 2021-11-20 ENCOUNTER — Telehealth: Payer: Self-pay | Admitting: *Deleted

## 2021-11-20 MED ORDER — OXYCODONE HCL 5 MG PO TABS: 5.0000 mg | ORAL_TABLET | ORAL | 0 refills | Status: DC | PRN

## 2021-11-20 NOTE — Telephone Encounter (Signed)
Patient called and had surgery with Dr [place] Abbot on 11/14/21 bilateral inguinal hernia, he is calling to see if he can get a refill on oxycodone ( [place])

## 2021-11-26 ENCOUNTER — Other Ambulatory Visit: Payer: Self-pay | Admitting: Internal Medicine

## 2021-11-27 NOTE — Telephone Encounter (Signed)
Requested medications are due for refill today.  unsure  Requested medications are on the active medications list.  yes  Last refill. 08/29/2021 #60 2 rf  Future visit scheduled.   no  Notes to clinic.  Refill not delegated.    Requested Prescriptions  Pending Prescriptions Disp Stuart 100-62.5-25 MCG/ACT AEPB [Pharmacy Med Name: TRELEGY ELLIPTA 100-62.5-25] 60 each 2    Sig: INHALE 1 PUFF BY MOUTH EVERY DAY     Off-Protocol Failed - 11/26/2021  9:32 AM      Failed - Medication not assigned to a protocol, review manually.      Passed - Valid encounter within last 12 months    Recent Outpatient Visits           4 weeks ago Preoperative clearance   [facility] [place], [place], NP   1 month ago Direct left inguinal hernia   [place], [place], NP   6 months ago Spinal stenosis of lumbar region without neurogenic claudication   [facility], Coralie Keens, NP       Future Appointments             In 2 months Agbor-Etang, Aaron Edelman, MD [place]. [place]

## 2021-11-28 ENCOUNTER — Encounter: Payer: Self-pay | Admitting: Physician Assistant

## 2021-11-28 ENCOUNTER — Ambulatory Visit (INDEPENDENT_AMBULATORY_CARE_PROVIDER_SITE_OTHER): Payer: Medicare Other | Admitting: Physician Assistant

## 2021-11-28 VITALS — BP 146/83 | HR 73 | Temp 98.0°F | Ht 69.0 in | Wt 178.0 lb

## 2021-11-28 DIAGNOSIS — K4020 Bilateral inguinal hernia, without obstruction or gangrene, not specified as recurrent: Secondary | ICD-10-CM

## 2021-11-28 DIAGNOSIS — Z09 Encounter for follow-up examination after completed treatment for conditions other than malignant neoplasm: Secondary | ICD-10-CM

## 2021-11-28 NOTE — Progress Notes (Signed)
[facility] POST-OP OFFICE VISIT  11/28/2021  HPI: Jeremy Turner is a 56 y.o. male 14 days s/p robotic assisted laparoscopic bilateral inguinal hernia repair with Dr [place] Abbot   He is doing very well Had discomfort initially but is now pain free No fever, chills, nausea, emesis, or bowel changes Some scrotal soreness but no ecchymosis or swelling No issues with incisions No other complaints   Vital signs: Ht 5\' 9"  (1.753 m)   Wt 178 lb (80.7 kg)   BMI 26.29 kg/m    Physical Exam: Constitutional: Well appearing male, NAD Abdomen: Soft, non-tender, non-distended, no rebound/guarding Skin: Laparoscopic incisions are healing well, no erythema or drainage   Assessment/Plan: This is a 56 y.o. male 14 days s/p robotic assisted laparoscopic bilateral inguinal hernia repair with Dr [place] Abbot    - Pain control prn  - Reviewed wound care recommendation  - Reviewed lifting restrictions; 4-6 weeks total  - He can follow up on as needed basis; He understands to call with questions/concerns  -- Edison Simon, PA-C [facility] 11/28/2021, 2:19 PM M-F: 7am - 4pm

## 2021-11-28 NOTE — Patient Instructions (Signed)

## 2021-12-04 ENCOUNTER — Encounter: Payer: Self-pay | Admitting: Internal Medicine

## 2021-12-07 ENCOUNTER — Other Ambulatory Visit: Payer: Self-pay | Admitting: Internal Medicine

## 2021-12-07 NOTE — Telephone Encounter (Signed)
Requested medication (s) are due for refill today: Due 12/08/21  Requested medication (s) are on the active medication list: yes    Last refill: 11/07/21 #60  0 refills  Future visit scheduled no  Notes to clinic:Not delegated. Please review. Thank you.  Requested Prescriptions  Pending Prescriptions Disp Refills   traMADol (ULTRAM) 50 MG tablet [Pharmacy Med Name: TRAMADOL HCL 50 MG TABLET] 60 tablet 0    Sig: TAKE 1 TABLET BY MOUTH TWICE A DAY AS NEEDED     Not Delegated - Analgesics:  Opioid Agonists Failed - 12/07/2021  2:07 PM      Failed - This refill cannot be delegated      Failed - Urine Drug Screen completed in last 360 days      Passed - Valid encounter within last 3 months    Recent Outpatient Visits           1 month ago Preoperative clearance   [facility] [place], [place], NP   2 months ago Direct left inguinal hernia   [facility] [place], [place], NP   6 months ago Spinal stenosis of lumbar region without neurogenic claudication   [facility] [address], Salv[place], NP       Future Appointments             In 1 month Agbor-Etang, Arlys John, MD [facility] A Dept Of [place]. [facility]

## 2021-12-15 ENCOUNTER — Other Ambulatory Visit: Payer: Self-pay | Admitting: Internal Medicine

## 2021-12-15 NOTE — Telephone Encounter (Signed)
Requested Prescriptions  Pending Prescriptions Disp Refills   metoprolol succinate (TOPROL-XL) 25 MG 24 hr tablet [Pharmacy Med Name: METOPROLOL SUCC ER 25 MG TAB] 180 tablet 0    Sig: TAKE 1 TABLET BY MOUTH TWICE A DAY     Cardiovascular:  Beta Blockers Failed - 12/15/2021  8:10 AM      Failed - Last BP in normal range    BP Readings from Last 1 Encounters:  11/28/21 (!) 146/83         Passed - Last Heart Rate in normal range    Pulse Readings from Last 1 Encounters:  11/28/21 73         Passed - Valid encounter within last 6 months    Recent Outpatient Visits           1 month ago Preoperative clearance   [facility] [place], Salv[place], NP   2 months ago Direct left inguinal hernia   [facility] [place], [place], NP   6 months ago Spinal stenosis of lumbar region without neurogenic claudication   [facility] [place], Salv[place], NP       Future Appointments             In 1 month Agbor-Etang, Arlys John, MD [facility] Dept Of [place]. [facility]

## 2022-01-02 ENCOUNTER — Ambulatory Visit: Payer: Medicare Other | Attending: Cardiology

## 2022-01-02 DIAGNOSIS — I2510 Atherosclerotic heart disease of native coronary artery without angina pectoris: Secondary | ICD-10-CM | POA: Diagnosis not present

## 2022-01-02 DIAGNOSIS — I4891 Unspecified atrial fibrillation: Secondary | ICD-10-CM

## 2022-01-02 LAB — ECHOCARDIOGRAM COMPLETE
AR max vel: 2.94 cm2
AV Area VTI: 3.23 cm2
AV Area mean vel: 3 cm2
AV Mean grad: 3 mmHg
AV Peak grad: 5.1 mmHg
Ao pk vel: 1.13 m/s
Area-P 1/2: 4.63 cm2
Calc EF: 49.7 %
S' Lateral: 3.7 cm
Single Plane A2C EF: 50.8 %
Single Plane A4C EF: 51.2 %

## 2022-01-05 ENCOUNTER — Telehealth: Payer: Self-pay | Admitting: Cardiology

## 2022-01-05 ENCOUNTER — Other Ambulatory Visit: Payer: Self-pay | Admitting: Internal Medicine

## 2022-01-05 NOTE — Telephone Encounter (Signed)
Returned call to patient.  Left detailed message with results on Vm - okay per [facility]

## 2022-01-05 NOTE — Telephone Encounter (Signed)
Requested medication (s) are due for refill today -yes  Requested medication (s) are on the active medication list -yes  Future visit scheduled -no  Last refill: 12/08/21 #60  Notes to clinic: non delegated Rx  Requested Prescriptions  Pending Prescriptions Disp Refills   traMADol (ULTRAM) 50 MG tablet [Pharmacy Med Name: TRAMADOL HCL 50 MG TABLET] 60 tablet 0    Sig: TAKE 1 TABLET BY MOUTH TWICE A DAY AS NEEDED     Not Delegated - Analgesics:  Opioid Agonists Failed - 01/05/2022  1:53 PM      Failed - This refill cannot be delegated      Failed - Urine Drug Screen completed in last 360 days      Passed - Valid encounter within last 3 months    Recent Outpatient Visits           2 months ago Preoperative clearance   [facility] [place], [place], NP   3 months ago Direct left inguinal hernia   [facility] [place], [place], NP   7 months ago Spinal stenosis of lumbar region without neurogenic claudication   [facility] [place], Salvad[place], NP       Future Appointments             In 4 weeks Agbor-Etang, Arlys John, MD [facility] A Dept Of [place]. [facility]               Requested Prescriptions  Pending Prescriptions Disp Refills   traMADol (ULTRAM) 50 MG tablet [Pharmacy Med Name: TRAMADOL HCL 50 MG TABLET] 60 tablet 0    Sig: TAKE 1 TABLET BY MOUTH TWICE A DAY AS NEEDED     Not Delegated - Analgesics:  Opioid Agonists Failed - 01/05/2022  1:53 PM      Failed - This refill cannot be delegated      Failed - Urine Drug Screen completed in last 360 days      Passed - Valid encounter within last 3 months    Recent Outpatient Visits           2 months ago Preoperative clearance   [facility] [place], Salv[place], NP   3 months ago Direct left inguinal hernia   [facility] [place], [place], NP   7 months ago Spinal stenosis of lumbar region without neurogenic claudication    [facility] [place], Salvadore [place], NP       Future Appointments             In 4 weeks Agbor-Etang, Arlys John, MD [facility] A Dept Of [place]. [facility]

## 2022-01-05 NOTE — Telephone Encounter (Signed)
Pt states he is returning a call  

## 2022-01-25 ENCOUNTER — Telehealth: Payer: Self-pay | Admitting: Internal Medicine

## 2022-01-25 NOTE — Telephone Encounter (Signed)
Left message for patient to call back and schedule Medicare Annual Wellness Visit (AWV) to be done virtually or by telephone.  No hx of AWV eligible as of 12/30/11  Please schedule at anytime with [facility]-Nurse Health Advisor.      45 Minutes appointment   Any questions, please call me at [phone] 

## 2022-02-01 ENCOUNTER — Ambulatory Visit (INDEPENDENT_AMBULATORY_CARE_PROVIDER_SITE_OTHER): Payer: Medicare Other | Admitting: Internal Medicine

## 2022-02-01 ENCOUNTER — Other Ambulatory Visit: Payer: Self-pay | Admitting: Internal Medicine

## 2022-02-01 ENCOUNTER — Encounter: Payer: Self-pay | Admitting: Internal Medicine

## 2022-02-01 VITALS — BP 134/71 | HR 45 | Temp 97.1°F | Ht 69.0 in | Wt 184.0 lb

## 2022-02-01 DIAGNOSIS — Z114 Encounter for screening for human immunodeficiency virus [HIV]: Secondary | ICD-10-CM | POA: Diagnosis not present

## 2022-02-01 DIAGNOSIS — Z1211 Encounter for screening for malignant neoplasm of colon: Secondary | ICD-10-CM

## 2022-02-01 DIAGNOSIS — E782 Mixed hyperlipidemia: Secondary | ICD-10-CM

## 2022-02-01 DIAGNOSIS — Z0001 Encounter for general adult medical examination with abnormal findings: Secondary | ICD-10-CM

## 2022-02-01 DIAGNOSIS — E663 Overweight: Secondary | ICD-10-CM | POA: Insufficient documentation

## 2022-02-01 DIAGNOSIS — Z125 Encounter for screening for malignant neoplasm of prostate: Secondary | ICD-10-CM

## 2022-02-01 DIAGNOSIS — Z1159 Encounter for screening for other viral diseases: Secondary | ICD-10-CM

## 2022-02-01 DIAGNOSIS — Z6827 Body mass index (BMI) 27.0-27.9, adult: Secondary | ICD-10-CM

## 2022-02-01 MED ORDER — TRAMADOL HCL 50 MG PO TABS: 50.0000 mg | ORAL_TABLET | Freq: Two times a day (BID) | ORAL | 0 refills | Status: DC | PRN

## 2022-02-01 NOTE — Patient Instructions (Signed)
Health Maintenance, Male Adopting a healthy lifestyle and getting preventive care are important in promoting health and wellness. Ask your health care provider about: The right schedule for you to have regular tests and exams. Things you can do on your own to prevent diseases and keep yourself healthy. What should I know about diet, weight, and exercise? Eat a healthy diet  Eat a diet that includes plenty of vegetables, fruits, low-fat dairy products, and lean protein. Do not eat a lot of foods that are high in solid fats, added sugars, or sodium. Maintain a healthy weight Body mass index (BMI) is a measurement that can be used to identify possible weight problems. It estimates body fat based on height and weight. Your health care provider can help determine your BMI and help you achieve or maintain a healthy weight. Get regular exercise Get regular exercise. This is one of the most important things you can do for your health. Most adults should: Exercise for at least 150 minutes each week. The exercise should increase your heart rate and make you sweat (moderate-intensity exercise). Do strengthening exercises at least twice a week. This is in addition to the moderate-intensity exercise. Spend less time sitting. Even light physical activity can be beneficial. Watch cholesterol and blood lipids Have your blood tested for lipids and cholesterol at 57 years of age, then have this test every 5 years. You may need to have your cholesterol levels checked more often if: Your lipid or cholesterol levels are high. You are older than 57 years of age. You are at high risk for heart disease. What should I know about cancer screening? Many types of cancers can be detected early and may often be prevented. Depending on your health history and family history, you may need to have cancer screening at various ages. This may include screening for: Colorectal cancer. Prostate cancer. Skin cancer. Lung  cancer. What should I know about heart disease, diabetes, and high blood pressure? Blood pressure and heart disease High blood pressure causes heart disease and increases the risk of stroke. This is more likely to develop in people who have high blood pressure readings or are overweight. Talk with your health care provider about your target blood pressure readings. Have your blood pressure checked: Every 3-5 years if you are 18-39 years of age. Every year if you are 40 years old or older. If you are between the ages of 65 and 75 and are a current or former smoker, ask your health care provider if you should have a one-time screening for abdominal aortic aneurysm (AAA). Diabetes Have regular diabetes screenings. This checks your fasting blood sugar level. Have the screening done: Once every three years after age 45 if you are at a normal weight and have a low risk for diabetes. More often and at a younger age if you are overweight or have a high risk for diabetes. What should I know about preventing infection? Hepatitis B If you have a higher risk for hepatitis B, you should be screened for this virus. Talk with your health care provider to find out if you are at risk for hepatitis B infection. Hepatitis C Blood testing is recommended for: Everyone born from 1945 through 1965. Anyone with known risk factors for hepatitis C. Sexually transmitted infections (STIs) You should be screened each year for STIs, including gonorrhea and chlamydia, if: You are sexually active and are younger than 57 years of age. You are older than 57 years of age and your   health care provider tells you that you are at risk for this type of infection. Your sexual activity has changed since you were last screened, and you are at increased risk for chlamydia or gonorrhea. Ask your health care provider if you are at risk. Ask your health care provider about whether you are at high risk for HIV. Your health care provider  may recommend a prescription medicine to help prevent HIV infection. If you choose to take medicine to prevent HIV, you should first get tested for HIV. You should then be tested every 3 months for as long as you are taking the medicine. Follow these instructions at home: Alcohol use Do not drink alcohol if your health care provider tells you not to drink. If you drink alcohol: Limit how much you have to 0-2 drinks a day. Know how much alcohol is in your drink. In the [place] one drink equals one 12 oz bottle of beer (355 mL), one 5 oz glass of wine (148 mL), or one 1 oz glass of hard liquor (44 mL). Lifestyle Do not use any products that contain nicotine or tobacco. These products include cigarettes, chewing tobacco, and vaping devices, such as e-cigarettes. If you need help quitting, ask your health care provider. Do not use street drugs. Do not share needles. Ask your health care provider for help if you need support or information about quitting drugs. General instructions Schedule regular health, dental, and eye exams. Stay current with your vaccines. Tell your health care provider if: You often feel depressed. You have ever been abused or do not feel safe at home. Summary Adopting a healthy lifestyle and getting preventive care are important in promoting health and wellness. Follow your health care provider's instructions about healthy diet, exercising, and getting tested or screened for diseases. Follow your health care provider's instructions on monitoring your cholesterol and blood pressure. This information is not intended to replace advice given to you by your health care provider. Make sure you discuss any questions you have with your health care provider. Document Revised: 06/06/2020 Document Reviewed: 06/06/2020 Elsevier Patient Education  2023 Elsevier Inc.  

## 2022-02-01 NOTE — Addendum Note (Signed)
Addended by: Jearld Fenton on: 02/01/2022 09:57 AM   Modules accepted: Orders

## 2022-02-01 NOTE — Telephone Encounter (Signed)
Requested medication (s) are due for refill today - yes  Requested medication (s) are on the active medication list -yes  Future visit scheduled -yes-today  Last refill: 01/05/22 #60  Notes to clinic: non delegated Rx  Requested Prescriptions  Pending Prescriptions Disp Refills   traMADol (ULTRAM) 50 MG tablet [Pharmacy Med Name: TRAMADOL HCL 50 MG TABLET] 60 tablet 0    Sig: TAKE 1 TABLET BY MOUTH TWICE A DAY AS NEEDED     Not Delegated - Analgesics:  Opioid Agonists Failed - 02/01/2022  5:28 AM      Failed - This refill cannot be delegated      Failed - Urine Drug Screen completed in last 360 days      Failed - Valid encounter within last 3 months    Recent Outpatient Visits           3 months ago Preoperative clearance   [facility] [place], Coralie Keens, NP   3 months ago Direct left inguinal hernia   [place], [place], NP   8 months ago Spinal stenosis of lumbar region without neurogenic claudication   [facility] [place], Coralie Keens, NP       Future Appointments             Today Garn[place] Gunner, Coralie Keens, NP [facility], [place], [place]. [facility]               Requested Prescriptions  Pending Prescriptions Disp Refills   traMADol (ULTRAM) 50 MG tablet [Pharmacy Med Name: TRAMADOL HCL 50 MG TABLET] 60 tablet 0    Sig: TAKE 1 TABLET BY MOUTH TWICE A DAY AS NEEDED     Not Delegated - Analgesics:  Opioid Agonists Failed - 02/01/2022  5:28 AM      Failed - This refill cannot be delegated      Failed - Urine Drug Screen completed in last 360 days      Failed - Valid encounter within last 3 months    Recent Outpatient Visits           3 months ago Preoperative clearance   [facility] [place], Coralie Keens, NP   3 months ago Direct left inguinal hernia   [place], [place] W, NP   8 months ago  Spinal stenosis of lumbar region without neurogenic claudication   [facility] [place], Coralie Keens, NP       Future Appointments             Today Garnette Gunner, Coralie Keens, NP [facility], [place], [place]. [place]

## 2022-02-01 NOTE — Assessment & Plan Note (Signed)
Encourage diet and exercise for weight loss 

## 2022-02-01 NOTE — Progress Notes (Signed)
Subjective:    Patient ID: Jeremy Turner, male    DOB: 1966/01/18, 57 y.o.   MRN: [id number]  HPI  Patient presents to clinic today for his annual exam.  Flu: never Tetanus: < 10 years ago COVID: never Shingrix: never PSA screening: > 1 year ago Colon screening: never Vision screening: as needed Dentist: as needed  Diet: He does eat meat. He consumes fruits and veggies. He tries to avoid fried foods. He drinks mostly coffee, some water. Exercise: Walking, stretching  Review of Systems     Past Medical History:  Diagnosis Date   Aortic atherosclerosis ([place])    Bilateral inguinal hernia    CAD (coronary artery disease) 03/2019   a.) LHC (done in [place]): 30% LCx disease - med mgmt.   Cardiac murmur    COPD (chronic obstructive pulmonary disease) (HCC)    DDD (degenerative disc disease), lumbar    HLD (hyperlipidemia)    Long term current use of anticoagulant    a.) apixaban   Marijuana use    a.) UDS (+) for [facility] 05/2021   PAF (paroxysmal atrial fibrillation) (HCC)    a.) CHA2DS2VASc = 1 (vascular disease history);  b.) rate/rhythm maintained on oral metoprolol succinate; chronically anticoagulated with apixaban   Stenosis of lateral recess of lumbar spine    a.) s/p laminectomy with L3-L5 posterior and interbody fusion   Uncomplicated opioid use    a.) long term Tramadol Rx'd by PCP; signed OUA on file    Current Outpatient Medications  Medication Sig Dispense Refill   acetaminophen (TYLENOL) 500 MG tablet Take 2 tablets (1,000 mg total) by mouth every 6 (six) hours as needed for mild pain.     albuterol (PROVENTIL) (2.5 MG/3ML) 0.083% nebulizer solution SMARTSIG:1 Vial(s) Via Nebulizer 4 Times Daily PRN     albuterol (VENTOLIN HFA) 108 (90 Base) MCG/ACT inhaler SMARTSIG:2 Puff(s) Via Inhaler 3 Times Daily PRN     atorvastatin (LIPITOR) 20 MG tablet Take 1 tablet (20 mg total) by mouth daily. 30 tablet 5   diclofenac Sodium (VOLTAREN) 1 % GEL Apply 2 g topically 4  (four) times daily.     ELIQUIS 5 MG TABS tablet TAKE 1 TABLET BY MOUTH TWICE A DAY AS DIRECTED 180 tablet 1   metoprolol succinate (TOPROL-XL) 25 MG 24 hr tablet TAKE 1 TABLET BY MOUTH TWICE A DAY 180 tablet 0   traMADol (ULTRAM) 50 MG tablet TAKE 1 TABLET BY MOUTH TWICE A DAY AS NEEDED 60 tablet 0   TRELEGY ELLIPTA 100-62.5-25 MCG/ACT AEPB INHALE 1 PUFF BY MOUTH EVERY DAY 60 each 2   No current facility-administered medications for this visit.    No Known Allergies  Family History  Problem Relation Age of Onset   Heart disease Father    Healthy Sister    Healthy Brother    Colon cancer Neg Hx    Prostate cancer Neg Hx     Social History   Socioeconomic History   Marital status: Significant Other    Spouse name: Izora Gala   Number of children: Not on file   Years of education: Not on file   Highest education level: Not on file  Occupational History   Not on file  Tobacco Use   Smoking status: Every Day    Packs/day: 0.10    Types: Cigarettes    Passive exposure: Past   Smokeless tobacco: Never   Tobacco comments:    3-4 cigarettes per day current 11-02-21  Vaping Use  Vaping Use: Never used  Substance and Sexual Activity   Alcohol use: Not Currently   Drug use: Not Currently    Types: Marijuana    Comment: daily   Sexual activity: Not on file  Other Topics Concern   Not on file  Social History Narrative   Not on file   Social Determinants of Health   Financial Resource Strain: Not on file  Food Insecurity: Not on file  Transportation Needs: Not on file  Physical Activity: Not on file  Stress: Not on file  Social Connections: Not on file  Intimate Partner Violence: Not on file     Constitutional: Denies fever, malaise, fatigue, headache or abrupt weight changes.  HEENT: Denies eye pain, eye redness, ear pain, ringing in the ears, wax buildup, runny nose, nasal congestion, bloody nose, or sore throat. Respiratory: Denies difficulty breathing, shortness of  breath, cough or sputum production.   Cardiovascular: Denies chest pain, chest tightness, palpitations or swelling in the hands or feet.  Gastrointestinal: Denies abdominal pain, bloating, constipation, diarrhea or blood in the stool.  GU: Denies urgency, frequency, pain with urination, burning sensation, blood in urine, odor or discharge. Musculoskeletal: Patient reports chronic low back pain.  Denies decrease in range of motion, difficulty with gait, muscle pain or joint swelling.  Skin: Denies redness, rashes, lesions or ulcercations.  Neurological: Denies dizziness, difficulty with memory, difficulty with speech or problems with balance and coordination.  Psych: Denies anxiety, depression, SI/HI.  No other specific complaints in a complete review of systems (except as listed in HPI above).  Objective:   Physical Exam  BP 134/71 (BP Location: Right Arm, Patient Position: Sitting, Cuff Size: Normal)   Pulse (!) 45   Temp (!) 97.1 F (36.2 C) (Temporal)   Ht 5\' 9"  (1.753 m)   Wt 184 lb (83.5 kg)   SpO2 99%   BMI 27.17 kg/m   Wt Readings from Last 3 Encounters:  11/28/21 178 lb (80.7 kg)  11/14/21 171 lb 15.3 oz (78 kg)  11/06/21 172 lb (78 kg)    General: Appears his stated age, overweight, in NAD. Skin: Warm, dry and intact.  HEENT: Head: normal shape and size; Eyes: sclera white, no icterus, conjunctiva pink, PERRLA and EOMs intact;  Neck:  Neck supple, trachea midline. No masses, lumps or thyromegaly present.  Cardiovascular: Bradycardic with normal rhythm. S1,S2 noted.  No murmur, rubs or gallops noted. No JVD or BLE edema. No carotid bruits noted. Pulmonary/Chest: Normal effort and positive vesicular breath sounds. No respiratory distress. No wheezes, rales or ronchi noted.  Abdomen: Normal bowel sounds.  Musculoskeletal: No bony tenderness noted over the lumbar spine.  Strength 5/5 BUE/BLE.  No difficulty with gait.  Neurological: Alert and oriented. Cranial nerves II-XII  grossly intact. Coordination normal.  Psychiatric: Mood and affect normal. Behavior is normal. Judgment and thought content normal.   BMET    Component Value Date/Time   NA 141 10/30/2021 0954   K 4.8 10/30/2021 0954   CL 105 10/30/2021 0954   CO2 27 10/30/2021 0954   GLUCOSE 99 10/30/2021 0954   BUN 15 10/30/2021 0954   CREATININE 0.93 10/30/2021 0954   CALCIUM 9.9 10/30/2021 0954    Lipid Panel     Component Value Date/Time   CHOL 245 (H) 10/30/2021 0954   TRIG 162 (H) 10/30/2021 0954   HDL 57 10/30/2021 0954   CHOLHDL 4.3 10/30/2021 0954   LDLCALC 158 (H) 10/30/2021 0954    CBC  Component Value Date/Time   WBC 6.8 10/30/2021 0954   RBC 5.57 10/30/2021 0954   HGB 16.5 10/30/2021 0954   HCT 47.7 10/30/2021 0954   PLT 244 10/30/2021 0954   MCV 85.6 10/30/2021 0954   MCH 29.6 10/30/2021 0954   MCHC 34.6 10/30/2021 0954   RDW 12.3 10/30/2021 0954    Hgb A1C Lab Results  Component Value Date   HGBA1C 5.4 10/30/2021            Assessment & Plan:   Preventative Health Maintenance:  He declines flu shot He declines tetanus booster Encouraged him to get his COVID-vaccine Discussed Shingrix vaccine, he will check coverage with his insurance company and schedule a visit at the pharmacy if he would like to have this done Cologuard ordered Encouraged him to consume a balanced diet and exercise regimen Advised him to see an eye doctor and dentist annually We will repeat lipid profile, PSA, HIV and hep C today, recent labs from 10/2021 reviewed  RTC in 6 months, follow-up chronic conditions Webb Silversmith, NP

## 2022-02-02 ENCOUNTER — Encounter: Payer: Self-pay | Admitting: Cardiology

## 2022-02-02 ENCOUNTER — Ambulatory Visit: Payer: Medicare Other | Attending: Cardiology | Admitting: Cardiology

## 2022-02-02 VITALS — BP 100/70 | HR 60 | Ht 69.0 in | Wt 186.5 lb

## 2022-02-02 DIAGNOSIS — I480 Paroxysmal atrial fibrillation: Secondary | ICD-10-CM | POA: Diagnosis not present

## 2022-02-02 DIAGNOSIS — E782 Mixed hyperlipidemia: Secondary | ICD-10-CM

## 2022-02-02 DIAGNOSIS — IMO0001 Reserved for inherently not codable concepts without codable children: Secondary | ICD-10-CM

## 2022-02-02 DIAGNOSIS — I2510 Atherosclerotic heart disease of native coronary artery without angina pectoris: Secondary | ICD-10-CM

## 2022-02-02 DIAGNOSIS — F17200 Nicotine dependence, unspecified, uncomplicated: Secondary | ICD-10-CM

## 2022-02-02 LAB — HEPATITIS C ANTIBODY: Hepatitis C Ab: NONREACTIVE

## 2022-02-02 LAB — LIPID PANEL
Cholesterol: 175 mg/dL (ref <200)
HDL: 60 mg/dL (ref >=40)
LDL Cholesterol (Calc): 94 mg/dL (calc)
Non-HDL Cholesterol (Calc): 115 mg/dL (calc) (ref <130)
Total CHOL/HDL Ratio: 2.9 (calc) (ref <5.0)
Triglycerides: 117 mg/dL (ref <150)

## 2022-02-02 LAB — HIV ANTIBODY (ROUTINE TESTING W REFLEX): HIV 1&2 Ab, 4th Generation: NONREACTIVE

## 2022-02-02 LAB — PSA: PSA: 0.89 ng/mL (ref <=4.00)

## 2022-02-02 NOTE — Patient Instructions (Signed)
Medication Instructions:   Your physician recommends that you continue on your current medications as directed. Please refer to the Current Medication list given to you today.  *If you need a refill on your cardiac medications before your next appointment, please call your pharmacy*   Lab Work:  None Ordered  If you have labs (blood work) drawn today and your tests are completely normal, you will receive your results only by: MyChart Message (if you have MyChart) OR A paper copy in the mail If you have any lab test that is abnormal or we need to change your treatment, we will call you to review the results.   Testing/Procedures:  None Ordered   Follow-Up: At [facility], you and your health needs are our priority.  As part of our continuing mission to provide you with exceptional heart care, we have created designated Provider Care Teams.  These Care Teams include your primary Cardiologist (physician) and Advanced Practice Providers (APPs -  Physician Assistants and Nurse Practitioners) who all work together to provide you with the care you need, when you need it.  We recommend signing up for the patient portal called "MyChart".  Sign up information is provided on this After Visit Summary.  MyChart is used to connect with patients for Virtual Visits (Telemedicine).  Patients are able to view lab/test results, encounter notes, upcoming appointments, etc.  Non-urgent messages can be sent to your provider as well.   To learn more about what you can do with MyChart, go to [email or web address].    Your next appointment:   12 month(s)  The format for your next appointment:   In Person  Provider:   You may see Brian Agbor-Etang, MD or one of the following Advanced Practice Providers on your designated Care Team:   Christopher Berge, NP Ryan Dunn, PA-C Cadence Furth, PA-C Sheri Hammock, NP    

## 2022-02-02 NOTE — Progress Notes (Signed)
Cardiology Office Note:    Date:  02/02/2022   ID:  Jeremy Turner, DOB 1965-06-12, MRN [id number]  PCP:  Jearld Fenton, NP   [place] Providers Cardiologist:  Kate Sable, MD     Referring MD: Jearld Fenton, NP   Chief Complaint  Patient presents with   Other    3 Month f/u echo no complaints today. Meds reviewed verbally with pt.    History of Present Illness:    Jeremy Turner is a 57 y.o. 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  Previously seen for preop evaluation prior to inguinal hernia repair.    Previous echo and left heart cath showed no obstructive disease, normal EF.  Underwent surgery successfully.  Denies chest pain, tolerating medications as prescribed.  No bleeding issues with Eliquis.  No new cardiac concerns.  Prior notes Echo 12/2021 EF 50 to 55% -echocardiogram 08/2020 EF 55% apparently had a stress test back in 2021 which was abnormal,  left heart cath 03/2019 showing 30% left circumflex disease.   Past Medical History:  Diagnosis Date   Aortic atherosclerosis ([place])    Bilateral inguinal hernia    CAD (coronary artery disease) 03/2019   a.) LHC (done in [place]): 30% LCx disease - med mgmt.   Cardiac murmur    COPD (chronic obstructive pulmonary disease) (HCC)    DDD (degenerative disc disease), lumbar    HLD (hyperlipidemia)    Long term current use of anticoagulant    a.) apixaban   Marijuana use    a.) UDS (+) for [facility] 05/2021   PAF (paroxysmal atrial fibrillation) (HCC)    a.) CHA2DS2VASc = 1 (vascular disease history);  b.) rate/rhythm maintained on oral metoprolol succinate; chronically anticoagulated with apixaban   Stenosis of lateral recess of lumbar spine    a.) s/p laminectomy with L3-L5 posterior and interbody fusion   Uncomplicated opioid use    a.) long term Tramadol Rx'd by PCP; signed OUA  on file    Past Surgical History:  Procedure Laterality Date   CARDIAC CATHETERIZATION  03/2019   Herina repair  10/2021   PATELLA FRACTURE SURGERY Left    1990's   POSTERIOR LUMBAR FUSION  09/2020   Procedure: LAMINECTOMY;  L3-L5 POSTERIOR AND INTERBODY FUSION    Current Medications: Current Meds  Medication Sig   acetaminophen (TYLENOL) 500 MG tablet Take 2 tablets (1,000 mg total) by mouth every 6 (six) hours as needed for mild pain.   albuterol (PROVENTIL) (2.5 MG/3ML) 0.083% nebulizer solution SMARTSIG:1 Vial(s) Via Nebulizer 4 Times Daily PRN   albuterol (VENTOLIN HFA) 108 (90 Base) MCG/ACT inhaler SMARTSIG:2 Puff(s) Via Inhaler 3 Times Daily PRN   atorvastatin (LIPITOR) 20 MG tablet Take 1 tablet (20 mg total) by mouth daily.   diclofenac Sodium (VOLTAREN) 1 % GEL Apply 2 g topically 4 (four) times daily.   ELIQUIS 5 MG TABS tablet TAKE 1 TABLET BY MOUTH TWICE A DAY AS DIRECTED   metoprolol succinate (TOPROL-XL) 25 MG 24 hr tablet TAKE 1 TABLET BY MOUTH TWICE A DAY   traMADol (ULTRAM) 50 MG tablet Take 1 tablet (50 mg total) by mouth 2 (two) times daily as needed.   TRELEGY ELLIPTA 100-62.5-25 MCG/ACT AEPB INHALE 1 PUFF BY MOUTH EVERY DAY     Allergies:   Patient has no known allergies.   Social History   Socioeconomic History  Marital status: Significant Other    Spouse name: Jeremy Turner   Number of children: Not on file   Years of education: Not on file   Highest education level: Not on file  Occupational History   Not on file  Tobacco Use   Smoking status: Every Day    Packs/day: 0.10    Types: Cigarettes    Passive exposure: Past   Smokeless tobacco: Never   Tobacco comments:    3-4 cigarettes per day current 11-02-21  Vaping Use   Vaping Use: Never used  Substance and Sexual Activity   Alcohol use: Not Currently   Drug use: Not Currently    Types: Marijuana    Comment: daily   Sexual activity: Not on file  Other Topics Concern   Not on file  Social  History Narrative   Not on file   Social Determinants of Health   Financial Resource Strain: Not on file  Food Insecurity: Not on file  Transportation Needs: Not on file  Physical Activity: Not on file  Stress: Not on file  Social Connections: Not on file     Family History: The patient's family history includes Healthy in his brother and sister; Heart disease in his father. There is no history of Colon cancer or Prostate cancer.  ROS:   Please see the history of present illness.     All other systems reviewed and are negative.  EKGs/Labs/Other Studies Reviewed:    The following studies were reviewed today:   EKG:  EKG is  ordered today.  The ekg ordered today demonstrates sinus rhythm, occasional PVCs.  Recent Labs: 10/30/2021: ALT 20; BUN 15; Creat 0.93; Hemoglobin 16.5; Platelets 244; Potassium 4.8; Sodium 141  Recent Lipid Panel    Component Value Date/Time   CHOL 175 02/01/2022 0951   TRIG 117 02/01/2022 0951   HDL 60 02/01/2022 0951   CHOLHDL 2.9 02/01/2022 0951   LDLCALC 94 02/01/2022 0951     Risk Assessment/Calculations:             Physical Exam:    VS:  BP 100/70 (BP Location: Left Arm, Patient Position: Sitting, Cuff Size: Normal)   Pulse 60   Ht 5\' 9"  (1.753 m)   Wt 186 lb 8 oz (84.6 kg)   SpO2 97%   BMI 27.54 kg/m     Wt Readings from Last 3 Encounters:  02/02/22 186 lb 8 oz (84.6 kg)  02/01/22 184 lb (83.5 kg)  11/28/21 178 lb (80.7 kg)     GEN:  Well nourished, well developed in no acute distress HEENT: Normal NECK: No JVD; No carotid bruits CARDIAC: RRR, no murmurs, rubs, gallops RESPIRATORY:  Clear to auscultation without rales, wheezing or rhonchi  ABDOMEN: Soft, non-tender, non-distended MUSCULOSKELETAL:  No edema; No deformity  SKIN: Warm and dry NEUROLOGIC:  Alert and oriented x 3 PSYCHIATRIC:  Normal affect   ASSESSMENT:    1. Coronary artery disease involving native coronary artery of native heart without angina pectoris    2. Paroxysmal atrial fibrillation (HCC)   3. Mixed hyperlipidemia   4. Smoking     PLAN:    In order of problems listed above:  Nonobstructive CAD, 30% left circumflex disease.  EF 50 to 55%.  Continue Lipitor 20 mg,  Eliquis. Paroxysmal atrial fibrillation, currently in sinus.  Continue Toprol-XL, Eliquis. Hyperlipidemia, cholesterol improving, continue Lipitor 20 mg daily. Current smoker, smoking cessation advised.  Follow-up in 1 year.    Medication Adjustments/Labs and Tests  Ordered: Current medicines are reviewed at length with the patient today.  Concerns regarding medicines are outlined above.  Orders Placed This Encounter  Procedures   EKG 12-Lead   No orders of the defined types were placed in this encounter.   Patient Instructions  Medication Instructions:   Your physician recommends that you continue on your current medications as directed. Please refer to the Current Medication list given to you today.  *If you need a refill on your cardiac medications before your next appointment, please call your pharmacy*   Lab Work:  None Ordered  If you have labs (blood work) drawn today and your tests are completely normal, you will receive your results only by: [place] (if you have MyChart) OR A paper copy in the mail If you have any lab test that is abnormal or we need to change your treatment, we will call you to review the results.   Testing/Procedures:  None Ordered   Follow-Up: At [facility], you and your health needs are our priority.  As part of our continuing mission to provide you with exceptional heart care, we have created designated Provider Care Teams.  These Care Teams include your primary Cardiologist (physician) and Advanced Practice Providers (APPs -  Physician Assistants and Nurse Practitioners) who all work together to provide you with the care you need, when you need it.  We recommend signing up for the patient portal  called "MyChart".  Sign up information is provided on this After Visit Summary.  MyChart is used to connect with patients for Virtual Visits (Telemedicine).  Patients are able to view lab/test results, encounter notes, upcoming appointments, etc.  Non-urgent messages can be sent to your provider as well.   To learn more about what you can do with MyChart, go to [email or web address].    Your next appointment:   12 month(s)  The format for your next appointment:   In [place]  Provider:   You may see Kate Sable, MD or one of the following Advanced Practice Providers on your designated Care Team:   Murray Hodgkins, NP Christell Faith, PA-C Cadence Kathlen Mody, PA-C Gerrie Nordmann, NP    Signed, Kate Sable, MD  02/02/2022 9:45 AM    [place]

## 2022-02-17 DIAGNOSIS — Z1211 Encounter for screening for malignant neoplasm of colon: Secondary | ICD-10-CM | POA: Diagnosis not present

## 2022-02-26 ENCOUNTER — Other Ambulatory Visit: Payer: Self-pay | Admitting: Internal Medicine

## 2022-02-26 LAB — COLOGUARD: COLOGUARD: POSITIVE — AB

## 2022-02-26 NOTE — Telephone Encounter (Signed)
Requested medication (s) are due for refill today: yes  Requested medication (s) are on the active medication list: yes  Last refill:  11/28/21  Future visit scheduled: yes  Notes to clinic:  Medication not assigned to a protocol, review manually.      Requested Prescriptions  Pending Prescriptions Disp [place] 100-62.5-25 MCG/ACT AEPB [Pharmacy Med Name: TRELEGY ELLIPTA 100-62.5-25] 60 each 2    Sig: INHALE 1 PUFF BY MOUTH EVERY DAY     Off-Protocol Failed - 02/26/2022  1:29 AM      Failed - Medication not assigned to a protocol, review manually.      Passed - Valid encounter within last 12 months    Recent Outpatient Visits           3 weeks ago Encounter for routine adult medical exam with abnormal findings   [facility] [place], Coralie Keens, NP   3 months ago Preoperative clearance   [facility] [place], Coralie Keens, NP   4 months ago Direct left inguinal hernia   [facility] [place], [place] W, NP   9 months ago Spinal stenosis of lumbar region without neurogenic claudication   [facility] [place], Coralie Keens, [place]

## 2022-03-02 ENCOUNTER — Other Ambulatory Visit: Payer: Self-pay | Admitting: Internal Medicine

## 2022-03-02 NOTE — Telephone Encounter (Signed)
Requested medications are due for refill today.  yes  Requested medications are on the active medications list.  yes  Last refill. 02/01/2022 #60 0 rf  Future visit scheduled.   no  Notes to clinic.  Refill not delegated.    Requested Prescriptions  Pending Prescriptions Disp Refills   traMADol (ULTRAM) 50 MG tablet [Pharmacy Med Name: TRAMADOL HCL 50 MG TABLET] 60 tablet 0    Sig: TAKE 1 TABLET BY MOUTH 2 TIMES DAILY AS NEEDED.     Not Delegated - Analgesics:  Opioid Agonists Failed - 03/02/2022  8:58 AM      Failed - This refill cannot be delegated      Failed - Urine Drug Screen completed in last 360 days      Passed - Valid encounter within last 3 months    Recent Outpatient Visits           4 weeks ago Encounter for routine adult medical exam with abnormal findings   [facility] [place], Coralie Keens, NP   4 months ago Preoperative clearance   [facility] [place], Coralie Keens, NP   4 months ago Direct left inguinal hernia   [facility] [place], [place] W, NP   9 months ago Spinal stenosis of lumbar region without neurogenic claudication   [facility] [place], Coralie Keens, [place]

## 2022-03-14 ENCOUNTER — Other Ambulatory Visit: Payer: Self-pay | Admitting: Internal Medicine

## 2022-03-14 NOTE — Telephone Encounter (Signed)
Requested Prescriptions  Pending Prescriptions Disp Refills   metoprolol succinate (TOPROL-XL) 25 MG 24 hr tablet [Pharmacy Med Name: METOPROLOL SUCC ER 25 MG TAB] 180 tablet 0    Sig: TAKE 1 TABLET BY MOUTH TWICE A DAY     Cardiovascular:  Beta Blockers Passed - 03/14/2022  2:37 AM      Passed - Last BP in normal range    BP Readings from Last 1 Encounters:  02/02/22 100/70         Passed - Last Heart Rate in normal range    Pulse Readings from Last 1 Encounters:  02/02/22 60         Passed - Valid encounter within last 6 months    Recent Outpatient Visits           1 month ago Encounter for routine adult medical exam with abnormal findings   [facility] [place], Coralie Keens, NP   4 months ago Preoperative clearance   [facility] [place], Coralie Keens, NP   5 months ago Direct left inguinal hernia   [facility] [place], [place], NP   9 months ago Spinal stenosis of lumbar region without neurogenic claudication   [facility] [place], Coralie Keens, [place]

## 2022-03-26 ENCOUNTER — Encounter: Payer: Self-pay | Admitting: Internal Medicine

## 2022-03-26 DIAGNOSIS — R195 Other fecal abnormalities: Secondary | ICD-10-CM

## 2022-03-27 ENCOUNTER — Telehealth: Payer: Self-pay | Admitting: Cardiology

## 2022-03-27 ENCOUNTER — Telehealth: Payer: Self-pay

## 2022-03-27 ENCOUNTER — Other Ambulatory Visit: Payer: Self-pay

## 2022-03-27 DIAGNOSIS — R195 Other fecal abnormalities: Secondary | ICD-10-CM

## 2022-03-27 DIAGNOSIS — Z1211 Encounter for screening for malignant neoplasm of colon: Secondary | ICD-10-CM

## 2022-03-27 MED ORDER — NA SULFATE-K SULFATE-MG SULF 17.5-3.13-1.6 GM/177ML PO SOLN: 1.0000 | Freq: Once | ORAL | 0 refills | Status: AC

## 2022-03-27 NOTE — Telephone Encounter (Signed)
   Pre-operative Risk Assessment    Patient Name: Jeremy Turner  DOB: 1965/04/09 MRN: [id number]      Request for Surgical Clearance    Procedure:   colonoscopy  Date of Surgery:  Clearance 04/11/22                                 Surgeon:  not indicated Surgeon's Group or Practice Name:  [facility] Phone number:  [phone] Fax number:  [phone]   Type of Clearance Requested:   - Pharmacy:  Hold Apixaban (Eliquis) instructions   Type of Anesthesia:  General    Additional requests/questions:    Manfred Arch   03/27/2022, 3:29 PM

## 2022-03-27 NOTE — Telephone Encounter (Signed)
Gastroenterology Pre-Procedure Review  Request Date: 04/11/22 Requesting Physician: Dr. Marius Ditch  PATIENT REVIEW QUESTIONS: The patient responded to the following health history questions as indicated:    1. Are you having any GI issues? no 2. Do you have a personal history of Polyps? no 3. Do you have a family history of Colon Cancer or Polyps? no 4. Diabetes Mellitus? no 5. Joint replacements in the past 12 month? No joint replacements, however patient had hernia surgery Oct 2023.  Laminectomy 2022 6. Major health problems in the past 3 months?no 7. Any artificial heart valves, MVP, or defibrillator?no    MEDICATIONS & ALLERGIES:    Patient reports the following regarding taking any anticoagulation/antiplatelet therapy:   Plavix, Coumadin, Eliquis, Xarelto, Lovenox, Pradaxa, Brilinta, or Effient? yes (Af[facility] Dr. Garen Lah takes Eliquis request sent to Dr. Garen Lah) Aspirin? no  Patient confirms/reports the following medications:  Current Outpatient Medications  Medication Sig Dispense Refill   acetaminophen (TYLENOL) 500 MG tablet Take 2 tablets (1,000 mg total) by mouth every 6 (six) hours as needed for mild pain.     albuterol (PROVENTIL) (2.5 MG/3ML) 0.083% nebulizer solution SMARTSIG:1 Vial(s) Via Nebulizer 4 Times Daily PRN     albuterol (VENTOLIN HFA) 108 (90 Base) MCG/ACT inhaler SMARTSIG:2 Puff(s) Via Inhaler 3 Times Daily PRN     atorvastatin (LIPITOR) 20 MG tablet Take 1 tablet (20 mg total) by mouth daily. 30 tablet 5   diclofenac Sodium (VOLTAREN) 1 % GEL Apply 2 g topically 4 (four) times daily.     ELIQUIS 5 MG TABS tablet TAKE 1 TABLET BY MOUTH TWICE A DAY AS DIRECTED 180 tablet 1   metoprolol succinate (TOPROL-XL) 25 MG 24 hr tablet TAKE 1 TABLET BY MOUTH TWICE A DAY 180 tablet 0   traMADol (ULTRAM) 50 MG tablet TAKE 1 TABLET BY MOUTH 2 TIMES DAILY AS NEEDED. 60 tablet 0   TRELEGY ELLIPTA 100-62.5-25 MCG/ACT AEPB INHALE 1 PUFF BY MOUTH EVERY DAY 60 each 5   No  current facility-administered medications for this visit.    Patient confirms/reports the following allergies:  No Known Allergies  No orders of the defined types were placed in this encounter.   AUTHORIZATION INFORMATION Primary Insurance: 1D#: Group #:  Secondary Insurance: 1D#: Group #:  SCHEDULE INFORMATION: Date: 04/11/22 Time: Location: [facility]

## 2022-03-27 NOTE — Telephone Encounter (Signed)
Will route to pharm for input. Recent OV 02/02/22, doing well. No concerns. Plan to clear given pharm request only.

## 2022-03-30 NOTE — Telephone Encounter (Signed)
   Patient Name: Jeremy Turner  DOB: 1965/03/11 MRN: [id number]  Primary Cardiologist: Kate Sable, MD  Chart reviewed as part of pre-operative protocol coverage. Pre-op clearance already addressed by colleagues in earlier phone notes. To summarize recommendations:  -Per office protocol, patient can hold Eliquis for 2 days prior to procedure.   Patient will not need bridging with Lovenox (enoxaparin) around procedure.  No medical clearance request.   Will route this bundled recommendation to requesting provider via Epic fax function and remove from pre-op pool. Please call with questions.  Elgie Collard, PA-C 03/30/2022, 7:43 AM

## 2022-03-30 NOTE — Telephone Encounter (Signed)
Patient with diagnosis of atrial fibrillation on Eliquis for anticoagulation.    Procedure: Colonoscopy Date of procedure: 04/11/22   CHA2DS2-VASc Score = 1   This indicates a 0.6% annual risk of stroke. The patient's score is based upon: CHF History: 0 HTN History: 0 Diabetes History: 0 Stroke History: 0 Vascular Disease History: 1 Age Score: 0 Gender Score: 0      CrCl 106 Platelet count 244  Per office protocol, patient can hold Eliquis for 2 days prior to procedure.   Patient will not need bridging with Lovenox (enoxaparin) around procedure.  **This guidance is not considered finalized until pre-operative APP has relayed final recommendations.**

## 2022-04-02 ENCOUNTER — Other Ambulatory Visit: Payer: Self-pay | Admitting: Internal Medicine

## 2022-04-02 ENCOUNTER — Telehealth: Payer: Self-pay

## 2022-04-02 NOTE — Telephone Encounter (Signed)
Patient has been advised to stop Eliquis 2 days prior to his colonoscopy.  Pt verbalized understanding.  During call he asked how he should go about taking his metoprolol and atorvastatin prior to colonoscopy.  He was advised to take Metoprolol the morning of procedure with just a sip of water, and to take atorvastatin after the procedure.  Thanks, [place], [place]

## 2022-04-03 NOTE — Telephone Encounter (Signed)
Requested medications are due for refill today.  unsure  Requested medications are on the active medications list.  yes  Last refill. 03/02/2022 #60 0 rf  Future visit scheduled.   no  Notes to clinic.  Refill not delegated.    Requested Prescriptions  Pending Prescriptions Disp Refills   traMADol (ULTRAM) 50 MG tablet [Pharmacy Med Name: TRAMADOL HCL 50 MG TABLET] 60 tablet 0    Sig: TAKE 1 TABLET BY MOUTH TWICE A DAY AS NEEDED     Not Delegated - Analgesics:  Opioid Agonists Failed - 04/02/2022  9:09 AM      Failed - This refill cannot be delegated      Failed - Urine Drug Screen completed in last 360 days      Passed - Valid encounter within last 3 months    Recent Outpatient Visits           2 months ago Encounter for routine adult medical exam with abnormal findings   [facility] [place], Coralie Keens, NP   5 months ago Preoperative clearance   [facility] [place], Coralie Keens, NP   5 months ago Direct left inguinal hernia   [facility] [place], [place] W, NP   10 months ago Spinal stenosis of lumbar region without neurogenic claudication   [facility] [place], Coralie Keens, [place]

## 2022-04-11 ENCOUNTER — Encounter
Admission: RE | Disposition: A | Discharge status: Home / Self Care | Payer: Self-pay | Source: Home / Self Care | Attending: Gastroenterology

## 2022-04-11 ENCOUNTER — Encounter: Payer: Self-pay | Admitting: Gastroenterology

## 2022-04-11 ENCOUNTER — Encounter: Payer: Self-pay | Admitting: Internal Medicine

## 2022-04-11 ENCOUNTER — Ambulatory Visit: Payer: Medicare Other | Admitting: Anesthesiology

## 2022-04-11 ENCOUNTER — Ambulatory Visit
Admission: RE | Admit: 2022-04-11 | Discharge: 2022-04-11 | Disposition: A | Discharge status: Home / Self Care | Payer: Medicare Other | Attending: Gastroenterology | Admitting: Gastroenterology

## 2022-04-11 DIAGNOSIS — Z7901 Long term (current) use of anticoagulants: Secondary | ICD-10-CM | POA: Insufficient documentation

## 2022-04-11 DIAGNOSIS — D123 Benign neoplasm of transverse colon: Secondary | ICD-10-CM | POA: Insufficient documentation

## 2022-04-11 DIAGNOSIS — I2510 Atherosclerotic heart disease of native coronary artery without angina pectoris: Secondary | ICD-10-CM | POA: Insufficient documentation

## 2022-04-11 DIAGNOSIS — Z1211 Encounter for screening for malignant neoplasm of colon: Secondary | ICD-10-CM

## 2022-04-11 DIAGNOSIS — J449 Chronic obstructive pulmonary disease, unspecified: Secondary | ICD-10-CM | POA: Diagnosis not present

## 2022-04-11 DIAGNOSIS — D122 Benign neoplasm of ascending colon: Secondary | ICD-10-CM | POA: Insufficient documentation

## 2022-04-11 DIAGNOSIS — I480 Paroxysmal atrial fibrillation: Secondary | ICD-10-CM | POA: Diagnosis not present

## 2022-04-11 DIAGNOSIS — D126 Benign neoplasm of colon, unspecified: Secondary | ICD-10-CM | POA: Diagnosis not present

## 2022-04-11 DIAGNOSIS — R195 Other fecal abnormalities: Secondary | ICD-10-CM | POA: Insufficient documentation

## 2022-04-11 DIAGNOSIS — F17210 Nicotine dependence, cigarettes, uncomplicated: Secondary | ICD-10-CM | POA: Insufficient documentation

## 2022-04-11 DIAGNOSIS — I4891 Unspecified atrial fibrillation: Secondary | ICD-10-CM | POA: Diagnosis not present

## 2022-04-11 DIAGNOSIS — E785 Hyperlipidemia, unspecified: Secondary | ICD-10-CM | POA: Insufficient documentation

## 2022-04-11 DIAGNOSIS — K635 Polyp of colon: Secondary | ICD-10-CM | POA: Diagnosis not present

## 2022-04-11 HISTORY — PX: COLONOSCOPY WITH PROPOFOL: SHX5780

## 2022-04-11 SURGERY — COLONOSCOPY WITH PROPOFOL
Anesthesia: General

## 2022-04-11 MED ORDER — PROPOFOL 10 MG/ML IV BOLUS
INTRAVENOUS | Status: AC
  Filled 2022-04-11: qty 20

## 2022-04-11 MED ORDER — PROPOFOL 500 MG/50ML IV EMUL
INTRAVENOUS | Status: DC | PRN
  Administered 2022-04-11: 75 ug/kg/min via INTRAVENOUS

## 2022-04-11 MED ORDER — SODIUM CHLORIDE 0.9 % IV SOLN: INTRAVENOUS | Status: DC

## 2022-04-11 MED ORDER — STERILE WATER FOR IRRIGATION IR SOLN
Status: DC | PRN
  Administered 2022-04-11: .06 mL

## 2022-04-11 MED ORDER — PROPOFOL 10 MG/ML IV BOLUS
INTRAVENOUS | Status: DC | PRN
  Administered 2022-04-11: 100 mg via INTRAVENOUS
  Administered 2022-04-11: 20 mg via INTRAVENOUS

## 2022-04-11 MED ORDER — LIDOCAINE HCL (CARDIAC) PF 100 MG/5ML IV SOSY
PREFILLED_SYRINGE | INTRAVENOUS | Status: DC | PRN
  Administered 2022-04-11: 80 mg via INTRAVENOUS

## 2022-04-11 MED ORDER — LIDOCAINE HCL (PF) 2 % IJ SOLN
INTRAMUSCULAR | Status: AC
  Filled 2022-04-11: qty 5

## 2022-04-11 NOTE — Anesthesia Preprocedure Evaluation (Signed)
Anesthesia Evaluation  Patient identified by MRN, date of birth, ID band Patient awake    Reviewed: Allergy & Precautions, NPO status , Patient's Chart, lab work & pertinent test results, reviewed documented beta blocker date and time   History of Anesthesia Complications Negative for: history of anesthetic complications  Airway Mallampati: III  TM Distance: >3 FB Neck ROM: full    Dental  (+) Chipped, Poor Dentition, Missing   Pulmonary neg shortness of breath, COPD,  COPD inhaler, Current Smoker and Patient abstained from smoking.   Pulmonary exam normal        Cardiovascular (-) angina + CAD and +CHF  (-) DOE + dysrhythmias Atrial Fibrillation + Valvular Problems/Murmurs MR      Neuro/Psych negative neurological ROS  negative psych ROS   GI/Hepatic negative GI ROS,,,(+)     substance abuse  marijuana use  Endo/Other  negative endocrine ROS    Renal/GU      Musculoskeletal  (+) Arthritis ,  Chronic opriod use    Abdominal   Peds  Hematology negative hematology ROS (+)   Anesthesia Other Findings Past Medical History: No date: Aortic atherosclerosis (HCC) No date: Bilateral inguinal hernia 03/2019: CAD (coronary artery disease)     Comment:  a.) LHC (done in [place]): 30% LCx disease - med mgmt. No date: Cardiac murmur No date: COPD (chronic obstructive pulmonary disease) (HCC) No date: DDD (degenerative disc disease), lumbar No date: HLD (hyperlipidemia) No date: Long term current use of anticoagulant     Comment:  a.) apixaban No date: Marijuana use     Comment:  a.) UDS (+) for [facility] 05/2021 No date: PAF (paroxysmal atrial fibrillation) (HCC)     Comment:  a.) CHA2DS2VASc = 1 (vascular disease history);  b.)               rate/rhythm maintained on oral metoprolol succinate;               chronically anticoagulated with apixaban No date: Stenosis of lateral recess of lumbar spine     Comment:  a.) s/p  laminectomy with L3-L5 posterior and interbody               fusion No date: Uncomplicated opioid use     Comment:  a.) long term Tramadol Rx'd by PCP; signed OUA on file  Past Surgical History: 03/2019: CARDIAC CATHETERIZATION No date: PATELLA FRACTURE SURGERY; Left     Comment:  1990's 09/2020: POSTERIOR LUMBAR FUSION     Comment:  Procedure: LAMINECTOMY;  L3-L5 POSTERIOR AND INTERBODY               FUSION  BMI    Body Mass Index: 25.39 kg/m      Reproductive/Obstetrics negative OB ROS                             Anesthesia Physical Anesthesia Plan  ASA: 3  Anesthesia Plan: General   Post-op Pain Management: Minimal or no pain anticipated   Induction: Intravenous  PONV Risk Score and Plan: 3 and Propofol infusion, TIVA and Ondansetron  Airway Management Planned: Nasal Cannula  Additional Equipment: None  Intra-op Plan:   Post-operative Plan: Extubation in OR  Informed Consent: I have reviewed the patients History and Physical, chart, labs and discussed the procedure including the risks, benefits and alternatives for the proposed anesthesia with the patient or authorized representative who has indicated his/her understanding and acceptance.  Dental advisory given  Plan Discussed with: CRNA and Surgeon  Anesthesia Plan Comments: (Discussed risks of anesthesia with patient, including possibility of difficulty with spontaneous ventilation under anesthesia necessitating airway intervention, PONV, and rare risks such as cardiac or respiratory or neurological events, and allergic reactions. Discussed the role of CRNA in patient's perioperative care. Patient understands.)        Anesthesia Quick Evaluation

## 2022-04-11 NOTE — Transfer of Care (Signed)
Immediate Anesthesia Transfer of Care Note  Patient: Jeremy Turner  Procedure(s) Performed: COLONOSCOPY WITH PROPOFOL  Patient Location: PACU and Endoscopy Unit  Anesthesia Type:General  Level of Consciousness: sedated  Airway & Oxygen Therapy: Patient Spontanous Breathing  Post-op Assessment: Report given to RN and Post -op Vital signs reviewed and stable  Post vital signs: Reviewed and stable  Last Vitals:  Vitals Value Taken Time  BP 104/76 04/11/22 1034  Temp    Pulse 35 04/11/22 1034  Resp 26 04/11/22 1034  SpO2 98 % 04/11/22 1034  Vitals shown include unvalidated device data.  Last Pain:  Vitals:   04/11/22 1033  TempSrc:   PainSc: 0-No pain         Complications: No notable events documented.

## 2022-04-11 NOTE — Anesthesia Postprocedure Evaluation (Signed)
Anesthesia Post Note  Patient: Jeremy Turner  Procedure(s) Performed: COLONOSCOPY WITH PROPOFOL  Patient location during evaluation: Endoscopy Anesthesia Type: General Level of consciousness: awake and alert Pain management: pain level controlled Vital Signs Assessment: post-procedure vital signs reviewed and stable Respiratory status: spontaneous breathing, nonlabored ventilation, respiratory function stable and patient connected to nasal cannula oxygen Cardiovascular status: blood pressure returned to baseline and stable Postop Assessment: no apparent nausea or vomiting Anesthetic complications: no  No notable events documented.   Last Vitals:  Vitals:   04/11/22 0900 04/11/22 1033  BP: 133/88 104/76  Pulse: 73   Resp: 16   Temp: (!) 36.1 C   SpO2: 99%     Last Pain:  Vitals:   04/11/22 1033  TempSrc:   PainSc: 0-No pain                 Dimas Millin

## 2022-04-11 NOTE — H&P (Signed)
Jeremy Darby, MD [address]  [place]  [place], [place] [address]  Main: [phone]  Fax: [phone] Pager: [phone]  Primary Care Physician:  Jearld Fenton, NP Primary Gastroenterologist:  Dr. Cephas Turner  Pre-Procedure History & Physical: HPI:  Jeremy Turner is a 57 y.o. male is here for an colonoscopy.   Past Medical History:  Diagnosis Date   Aortic atherosclerosis ([place])    Bilateral inguinal hernia    CAD (coronary artery disease) 03/2019   a.) LHC (done in [place]): 30% LCx disease - med mgmt.   Cardiac murmur    COPD (chronic obstructive pulmonary disease) (HCC)    DDD (degenerative disc disease), lumbar    HLD (hyperlipidemia)    Long term current use of anticoagulant    a.) apixaban   Marijuana use    a.) UDS (+) for [facility] 05/2021   PAF (paroxysmal atrial fibrillation) (HCC)    a.) CHA2DS2VASc = 1 (vascular disease history);  b.) rate/rhythm maintained on oral metoprolol succinate; chronically anticoagulated with apixaban   Stenosis of lateral recess of lumbar spine    a.) s/p laminectomy with L3-L5 posterior and interbody fusion   Uncomplicated opioid use    a.) long term Tramadol Rx'd by PCP; signed OUA on file    Past Surgical History:  Procedure Laterality Date   CARDIAC CATHETERIZATION  03/2019   Herina repair  10/2021   PATELLA FRACTURE SURGERY Left    1990's   POSTERIOR LUMBAR FUSION  09/2020   Procedure: LAMINECTOMY;  L3-L5 POSTERIOR AND INTERBODY FUSION    Prior to Admission medications   Medication Sig Start Date End Date Taking? Authorizing Provider  metoprolol succinate (TOPROL-XL) 25 MG 24 hr tablet TAKE 1 TABLET BY MOUTH TWICE A DAY 03/14/22  Yes Baity, Coralie Keens, NP  acetaminophen (TYLENOL) 500 MG tablet Take 2 tablets (1,000 mg total) by mouth every 6 (six) hours as needed for mild pain. 11/14/21   Olean Ree, MD  albuterol (PROVENTIL) (2.5 MG/3ML) 0.083% nebulizer solution SMARTSIG:1 Vial(s) Via Nebulizer 4 Times Daily  PRN 01/25/21   [provider]  albuterol (VENTOLIN HFA) 108 (90 Base) MCG/ACT inhaler SMARTSIG:2 Puff(s) Via Inhaler 3 Times Daily PRN 05/22/21   [provider]  aspirin EC 81 MG tablet Take by mouth. 02/04/19   [provider]  atorvastatin (LIPITOR) 20 MG tablet Take 1 tablet (20 mg total) by mouth daily. 11/02/21   Kate Sable, MD  buprenorphine-naloxone (SUBOXONE) 2-0.5 mg SUBL SL tablet Place under the tongue. 01/30/20   [provider]  diclofenac Sodium (VOLTAREN) 1 % GEL Apply 2 g topically 4 (four) times daily. 01/25/21   [provider]  ELIQUIS 5 MG TABS tablet TAKE 1 TABLET BY MOUTH TWICE A DAY AS DIRECTED 10/05/21   Jearld Fenton, NP  traMADol (ULTRAM) 50 MG tablet TAKE 1 TABLET BY MOUTH TWICE A DAY AS NEEDED 04/03/22   Jearld Fenton, NP  TRELEGY ELLIPTA 100-62.5-25 MCG/ACT AEPB INHALE 1 PUFF BY MOUTH EVERY DAY 02/27/22   Jearld Fenton, NP    Allergies as of 03/27/2022 - Review Complete 03/27/2022  Allergen Reaction Noted   Cefadroxil Hives and Palpitations 10/14/2020    Family History  Problem Relation Age of Onset   Heart disease Father    Healthy Sister    Healthy Brother    Colon cancer Neg Hx    Prostate cancer Neg Hx     Social History   Socioeconomic History  Marital status: Significant Other    Spouse name: Izora Gala   Number of children: Not on file   Years of education: Not on file   Highest education level: Not on file  Occupational History   Not on file  Tobacco Use   Smoking status: Every Day    Packs/day: 0.10    Types: Cigarettes    Passive exposure: Past   Smokeless tobacco: Never   Tobacco comments:    3-4 cigarettes per day current 11-02-21  Vaping Use   Vaping Use: Never used  Substance and Sexual Activity   Alcohol use: Not Currently   Drug use: Not Currently    Types: Marijuana    Comment: daily   Sexual activity: Not on file  Other Topics Concern   Not on file  Social History Narrative    Not on file   Social Determinants of Health   Financial Resource Strain: Not on file  Food Insecurity: Not on file  Transportation Needs: Not on file  Physical Activity: Not on file  Stress: Not on file  Social Connections: Not on file  Intimate Partner Violence: Not on file    Review of Systems: See HPI, otherwise negative ROS  Physical Exam: BP 133/88   Pulse 73   Temp (!) 96.9 F (36.1 C) (Temporal)   Resp 16   Ht '5\' 9"'$  (1.753 m)   Wt 85.7 kg   SpO2 99%   BMI 27.91 kg/m  General:   Alert,  pleasant and cooperative in NAD Head:  Normocephalic and atraumatic. Neck:  Supple; no masses or thyromegaly. Lungs:  Clear throughout to auscultation.    Heart:  Regular rate and rhythm. Abdomen:  Soft, nontender and nondistended. Normal bowel sounds, without guarding, and without rebound.   Neurologic:  Alert and  oriented x4;  grossly normal neurologically.  Impression/Plan: Jeremy Turner is here for an colonoscopy to be performed for cologuard positive  Risks, benefits, limitations, and alternatives regarding  colonoscopy have been reviewed with the patient.  Questions have been answered.  All parties agreeable.   Sherri Sear, MD  04/11/2022, 10:04 AM

## 2022-04-11 NOTE — Op Note (Signed)
[facility] Patient Name: Jeremy Turner Procedure Date: 04/11/2022 10:03 AM MRN: [id number] Account #: [id number] Date of Birth: 10-08-1965 Admit Type: Outpatient Age: 57 Room: [facility] ENDO ROOM 3 Gender: Male Note Status: Finalized Instrument Name: Colonoscope [id number] Procedure:             Colonoscopy Indications:           This is the patient's first colonoscopy, Positive                         Cologuard test Providers:             Lin Landsman MD, MD Referring MD:          Jearld Fenton (Referring MD) Medicines:             General Anesthesia Complications:         No immediate complications. Estimated blood loss: None. Procedure:             Pre-Anesthesia Assessment:                        - Prior to the procedure, a History and Physical was                         performed, and patient medications and allergies were                         reviewed. The patient is competent. The risks and                         benefits of the procedure and the sedation options and                         risks were discussed with the patient. All questions                         were answered and informed consent was obtained.                         Patient identification and proposed procedure were                         verified by the physician, the nurse, the                         anesthesiologist, the anesthetist and the technician                         in the pre-procedure area in the procedure room in the                         endoscopy suite. Mental Status Examination: alert and                         oriented. Airway Examination: normal oropharyngeal                         airway and neck mobility. Respiratory Examination:  clear to auscultation. CV Examination: normal.                         Prophylactic Antibiotics: The patient does not require                         prophylactic antibiotics. Prior  Anticoagulants: The                         patient has taken Eliquis (apixaban), last dose was 3                         days prior to procedure. ASA Grade Assessment: III - A                         patient with severe systemic disease. After reviewing                         the risks and benefits, the patient was deemed in                         satisfactory condition to undergo the procedure. The                         anesthesia plan was to use general anesthesia.                         Immediately prior to administration of medications,                         the patient was re-assessed for adequacy to receive                         sedatives. The heart rate, respiratory rate, oxygen                         saturations, blood pressure, adequacy of pulmonary                         ventilation, and response to care were monitored                         throughout the procedure. The physical status of the                         patient was re-assessed after the procedure.                        After obtaining informed consent, the colonoscope was                         passed under direct vision. Throughout the procedure,                         the patient's blood pressure, pulse, and oxygen                         saturations were monitored continuously. The  Colonoscope was introduced through the anus and                         advanced to the the cecum, identified by appendiceal                         orifice and ileocecal valve. The colonoscopy was                         performed without difficulty. The patient tolerated                         the procedure well. The quality of the bowel                         preparation was evaluated using the BBPS [facility] Bowel                         Preparation Scale) with scores of: Right Colon = 3,                         Transverse Colon = 3 and Left Colon = 3 (entire mucosa                          seen well with no residual staining, small fragments                         of stool or opaque liquid). The total BBPS score                         equals 9. The ileocecal valve, appendiceal orifice,                         and rectum were photographed. Findings:      The perianal and digital rectal examinations were normal. Pertinent       negatives include normal sphincter tone and no palpable rectal lesions.      Four sessile polyps were found in the transverse colon and ascending       colon. The polyps were 4 to 9 mm in size. These polyps were removed with       a cold snare. Resection and retrieval were complete. Estimated blood       loss: none.      The retroflexed view of the distal rectum and anal verge was normal and       showed no anal or rectal abnormalities. Impression:            - Four 4 to 9 mm polyps in the transverse colon and in                         the ascending colon, removed with a cold snare.                         Resected and retrieved.                        - The distal rectum and anal verge are normal on  retroflexion view. Recommendation:        - Discharge patient to home (with escort).                        - Resume previous diet today.                        - Continue present medications.                        - Await pathology results.                        - Resume Eliquis (apixaban) in 3 days at prior dose.                         Refer to managing physician for further adjustment of                         therapy.                        - Repeat colonoscopy in 3 years for surveillance of                         multiple polyps. Procedure Code(s):     --- Professional ---                        [phone], Colonoscopy, flexible; with removal of                         tumor(s), polyp(s), or other lesion(s) by snare                         technique Diagnosis Code(s):     --- Professional ---                         D12.3, Benign neoplasm of transverse colon (hepatic                         flexure or splenic flexure)                        D12.2, Benign neoplasm of ascending colon                        R19.5, Other fecal abnormalities CPT copyright 2022 American Medical Association. All rights reserved. The codes documented in this report are preliminary and upon coder review may  be revised to meet current compliance requirements. Dr. Ulyess Mort Lin Landsman MD, MD 04/11/2022 10:33:33 AM This report has been signed electronically. Number of Addenda: 0 Note Initiated On: 04/11/2022 10:03 AM Scope Withdrawal Time: 0 hours 12 minutes 20 seconds  Total Procedure Duration: 0 hours 14 minutes 14 seconds  Estimated Blood Loss:  Estimated blood loss: none.      [facility]

## 2022-04-12 ENCOUNTER — Encounter: Payer: Self-pay | Admitting: Gastroenterology

## 2022-04-12 LAB — SURGICAL PATHOLOGY

## 2022-04-13 ENCOUNTER — Telehealth: Payer: Self-pay | Admitting: Internal Medicine

## 2022-04-13 NOTE — Telephone Encounter (Signed)
Contacted Jeremy Turner to schedule their annual wellness visit. Appointment made for 05/11/2022.  Sherol Dade; Care Guide Ambulatory [facility] Direct Dial: [phone]

## 2022-04-27 ENCOUNTER — Other Ambulatory Visit: Payer: Self-pay

## 2022-04-27 MED ORDER — ATORVASTATIN CALCIUM 20 MG PO TABS: 20.0000 mg | ORAL_TABLET | Freq: Every day | ORAL | 8 refills | Status: DC

## 2022-05-04 ENCOUNTER — Other Ambulatory Visit: Payer: Self-pay | Admitting: Internal Medicine

## 2022-05-07 NOTE — Telephone Encounter (Signed)
Requested medication (s) are due for refill today: yes  Requested medication (s) are on the active medication list: yes  Last refill:  04/03/22  Future visit scheduled: no  Notes to clinic:  Unable to refill per protocol, cannot delegate.      Requested Prescriptions  Pending Prescriptions Disp Refills   traMADol (ULTRAM) 50 MG tablet [Pharmacy Med Name: TRAMADOL HCL 50 MG TABLET] 60 tablet 0    Sig: TAKE 1 TABLET BY MOUTH TWICE A DAY AS NEEDED     Not Delegated - Analgesics:  Opioid Agonists Failed - 05/04/2022  6:52 PM      Failed - This refill cannot be delegated      Failed - Urine Drug Screen completed in last 360 days      Failed - Valid encounter within last 3 months    Recent Outpatient Visits           3 months ago Encounter for routine adult medical exam with abnormal findings   [facility] [place], Salvadore Oxford, NP   6 months ago Preoperative clearance   [facility] [place], Salvadore Oxford, NP   7 months ago Direct left inguinal hernia   [facility] [place], [place] W, NP   11 months ago Spinal stenosis of lumbar region without neurogenic claudication   [facility] [place], Salvadore Oxford, [place]

## 2022-05-11 ENCOUNTER — Ambulatory Visit (INDEPENDENT_AMBULATORY_CARE_PROVIDER_SITE_OTHER): Payer: Medicare Other

## 2022-05-11 VITALS — BP 120/78 | Ht 69.0 in | Wt 186.0 lb

## 2022-05-11 DIAGNOSIS — Z0000 Encounter for general adult medical examination without abnormal findings: Secondary | ICD-10-CM | POA: Diagnosis not present

## 2022-05-11 NOTE — Progress Notes (Signed)
I connected with  Jeremy Turner on 05/11/22 by a audio enabled telemedicine application and verified that I am speaking with the correct person using two identifiers.  Patient Location: Home  Provider Location: Office/Clinic  I discussed the limitations of evaluation and management by telemedicine. The patient expressed understanding and agreed to proceed.  Subjective:   Jeremy Turner is a 57 y.o. male who presents for Medicare Annual/Subsequent preventive examination.  Review of Systems     Cardiac Risk Factors include: advanced age (>93men, >64 women);male gender;hypertension     Objective:    Today's Vitals   05/11/22 1033  PainSc: 5    There is no height or weight on file to calculate BMI.     05/11/2022   10:37 AM 04/11/2022    8:58 AM 11/14/2021    8:49 AM 11/06/2021    9:11 AM  Advanced Directives  Does Patient Have a Medical Advance Directive? No No No No  Would patient like information on creating a medical advance directive? No - Patient declined  No - Patient declined     Current Medications (verified) Outpatient Encounter Medications as of 05/11/2022  Medication Sig   acetaminophen (TYLENOL) 500 MG tablet Take 2 tablets (1,000 mg total) by mouth every 6 (six) hours as needed for mild pain.   albuterol (PROVENTIL) (2.5 MG/3ML) 0.083% nebulizer solution SMARTSIG:1 Vial(s) Via Nebulizer 4 Times Daily PRN   albuterol (VENTOLIN HFA) 108 (90 Base) MCG/ACT inhaler SMARTSIG:2 Puff(s) Via Inhaler 3 Times Daily PRN   atorvastatin (LIPITOR) 20 MG tablet Take 1 tablet (20 mg total) by mouth daily.   diclofenac Sodium (VOLTAREN) 1 % GEL Apply 2 g topically 4 (four) times daily.   ELIQUIS 5 MG TABS tablet TAKE 1 TABLET BY MOUTH TWICE A DAY AS DIRECTED   metoprolol succinate (TOPROL-XL) 25 MG 24 hr tablet TAKE 1 TABLET BY MOUTH TWICE A DAY   traMADol (ULTRAM) 50 MG tablet TAKE 1 TABLET BY MOUTH TWICE A DAY AS NEEDED   TRELEGY ELLIPTA 100-62.5-25 MCG/ACT AEPB INHALE  1 PUFF BY MOUTH EVERY DAY   aspirin EC 81 MG tablet Take by mouth. (Patient not taking: Reported on 05/11/2022)   No facility-administered encounter medications on file as of 05/11/2022.    Allergies (verified) Cefadroxil   History: Past Medical History:  Diagnosis Date   Aortic atherosclerosis    Bilateral inguinal hernia    CAD (coronary artery disease) 03/2019   a.) LHC (done in [place]): 30% LCx disease - med mgmt.   Cardiac murmur    COPD (chronic obstructive pulmonary disease)    DDD (degenerative disc disease), lumbar    HLD (hyperlipidemia)    Long term current use of anticoagulant    a.) apixaban   Marijuana use    a.) UDS (+) for [facility] 05/2021   PAF (paroxysmal atrial fibrillation)    a.) CHA2DS2VASc = 1 (vascular disease history);  b.) rate/rhythm maintained on oral metoprolol succinate; chronically anticoagulated with apixaban   Stenosis of lateral recess of lumbar spine    a.) s/p laminectomy with L3-L5 posterior and interbody fusion   Uncomplicated opioid use    a.) long term Tramadol Rx'd by PCP; signed OUA on file   Past Surgical History:  Procedure Laterality Date   CARDIAC CATHETERIZATION  03/2019   COLONOSCOPY WITH PROPOFOL N/A 04/11/2022   Procedure: COLONOSCOPY WITH PROPOFOL;  Surgeon: Toney Reil, MD;  Location: [facility] ENDOSCOPY;  Service: Gastroenterology;  Laterality: N/A;   Herina repair  10/2021  PATELLA FRACTURE SURGERY Left    1990's   POSTERIOR LUMBAR FUSION  09/2020   Procedure: LAMINECTOMY;  L3-L5 POSTERIOR AND INTERBODY FUSION   Family History  Problem Relation Age of Onset   Heart disease Father    Healthy Sister    Healthy Brother    Colon cancer Neg Hx    Prostate cancer Neg Hx    Social History   Socioeconomic History   Marital status: Significant Other    Spouse name: Harriett Sine   Number of children: Not on file   Years of education: Not on file   Highest education level: Not on file  Occupational History   Not on file  Tobacco  Use   Smoking status: Every Day    Packs/day: .1    Types: Cigarettes    Passive exposure: Past   Smokeless tobacco: Never   Tobacco comments:    3-4 cigarettes per day current 11-02-21  Vaping Use   Vaping Use: Never used  Substance and Sexual Activity   Alcohol use: Not Currently   Drug use: Not Currently    Types: Marijuana    Comment: daily   Sexual activity: Not on file  Other Topics Concern   Not on file  Social History Narrative   Not on file   Social Determinants of Health   Financial Resource Strain: Low Risk  (05/11/2022)   Overall Financial Resource Strain (CARDIA)    Difficulty of Paying Living Expenses: Not hard at all  Food Insecurity: No Food Insecurity (05/11/2022)   Hunger Vital Sign    Worried About Running Out of Food in the Last Year: Never true    Ran Out of Food in the Last Year: Never true  Transportation Needs: No Transportation Needs (05/11/2022)   PRAPARE - Administrator, Civil Service (Medical): No    Lack of Transportation (Non-Medical): No  Physical Activity: Insufficiently Active (05/11/2022)   Exercise Vital Sign    Days of Exercise per Week: 5 days    Minutes of Exercise per Session: 20 min  Stress: No Stress Concern Present (05/11/2022)   [employer] of Occupational Health - Occupational Stress Questionnaire    Feeling of Stress : Not at all  Social Connections: Moderately Integrated (05/11/2022)   Social Connection and Isolation Panel [NHANES]    Frequency of Communication with Friends and Family: More than three times a week    Frequency of Social Gatherings with Friends and Family: More than three times a week    Attends Religious Services: Never    Database administrator or Organizations: Yes    Attends Engineer, structural: More than 4 times per year    Marital Status: Living with partner    Tobacco Counseling Ready to quit: Not Answered Counseling given: Not Answered Tobacco comments: 3-4 cigarettes per  day current 11-02-21   Clinical Intake:  Pre-visit preparation completed: Yes  Pain : 0-10 Pain Score: 5  Pain Location: Back     Diabetes: No  How often do you need to have someone help you when you read instructions, pamphlets, or other written materials from your doctor or pharmacy?: 1 - Never  Diabetic?no  Interpreter Needed?: No  Information entered by :: Kennedy Bucker LPN   Activities of Daily Living    05/11/2022   10:38 AM 05/10/2022    9:08 AM  In your present state of health, do you have any difficulty performing the following activities:  Hearing? 0 0  Vision? 0 0  Difficulty concentrating or making decisions? 0 0  Walking or climbing stairs? 1 1  Dressing or bathing? 0 0  Doing errands, shopping? 0 0  Preparing Food and eating ? N N  Using the Toilet? N N  In the past six months, have you accidently leaked urine? N N  Do you have problems with loss of bowel control? N N  Managing your Medications? N N  Managing your Finances? Malvin Johns  Housekeeping or managing your Housekeeping? N N    Patient Care Team: Lorre Munroe, NP as PCP - General (Internal Medicine) Debbe Odea, MD as PCP - Cardiology (Cardiology)  Indicate any recent Medical Services you may have received from other than [facility] providers in the past year (date may be approximate).     Assessment:   This is a routine wellness examination for [place].  Hearing/Vision screen Hearing Screening - Comments:: No aids Vision Screening - Comments:: No glasses  Dietary issues and exercise activities discussed: Current Exercise Habits: Home exercise routine, Type of exercise: walking, Time (Minutes): 30, Frequency (Times/Week): 5, Weekly Exercise (Minutes/Week): 150, Intensity: Mild   Goals Addressed             This Visit's Progress    DIET - EAT MORE FRUITS AND VEGETABLES         Depression Screen    05/11/2022   10:35 AM 02/01/2022    9:51 AM 10/30/2021   10:27 AM 10/06/2021     2:12 PM 05/30/2021    2:41 PM  PHQ 2/9 Scores  PHQ - 2 Score 0 0 0 0 0  PHQ- 9 Score 0  0 0 5    Fall Risk    05/11/2022   10:37 AM 05/10/2022    9:08 AM 02/01/2022    9:51 AM 10/30/2021   10:27 AM 10/20/2021   10:51 AM  Fall Risk   Falls in the past year? 0 0 0 0 0  Number falls in past yr: 0 0  0   Injury with Fall? 0 0 0 0   Risk for fall due to : No Fall Risks   No Fall Risks   Follow up Falls prevention discussed;Falls evaluation completed   Falls evaluation completed     FALL RISK PREVENTION PERTAINING TO THE HOME:  Any stairs in or around the home? Yes  If so, are there any without handrails? No  Home free of loose throw rugs in walkways, pet beds, electrical cords, etc? Yes  Adequate lighting in your home to reduce risk of falls? Yes   ASSISTIVE DEVICES UTILIZED TO PREVENT FALLS:  Life alert? No  Use of a cane, walker or w/c? No  Grab bars in the bathroom? No  Shower chair or bench in shower? No  Elevated toilet seat or a handicapped toilet? No   Cognitive Function:        05/11/2022   10:42 AM  6CIT Screen  What Year? 0 points  What month? 0 points  What time? 0 points  Count back from 20 0 points  Months in reverse 0 points  Repeat phrase 0 points  Total Score 0 points    Immunizations  There is no immunization history on file for this patient.  TDAP status: Due, Education has been provided regarding the importance of this vaccine. Advised may receive this vaccine at local pharmacy or Health Dept. Aware to provide a copy of the vaccination record if obtained from local pharmacy  or Health Dept. Verbalized acceptance and understanding.  Flu Vaccine status: Declined, Education has been provided regarding the importance of this vaccine but patient still declined. Advised may receive this vaccine at local pharmacy or Health Dept. Aware to provide a copy of the vaccination record if obtained from local pharmacy or Health Dept. Verbalized acceptance and  understanding.  Pneumococcal vaccine status: Declined,  Education has been provided regarding the importance of this vaccine but patient still declined. Advised may receive this vaccine at local pharmacy or Health Dept. Aware to provide a copy of the vaccination record if obtained from local pharmacy or Health Dept. Verbalized acceptance and understanding.   Covid-19 vaccine status: Declined, Education has been provided regarding the importance of this vaccine but patient still declined. Advised may receive this vaccine at local pharmacy or Health Dept.or vaccine clinic. Aware to provide a copy of the vaccination record if obtained from local pharmacy or Health Dept. Verbalized acceptance and understanding.  Qualifies for Shingles Vaccine? Yes   Zostavax completed No   Shingrix Completed?: No.    Education has been provided regarding the importance of this vaccine. Patient has been advised to call insurance company to determine out of pocket expense if they have not yet received this vaccine. Advised may also receive vaccine at local pharmacy or Health Dept. Verbalized acceptance and understanding.  Screening Tests Health Maintenance  Topic Date Due   COVID-19 Vaccine (1) Never done   DTaP/Tdap/Td (1 - Tdap) Never done   Lung Cancer Screening  Never done   Zoster Vaccines- Shingrix (1 of 2) Never done   INFLUENZA VACCINE  08/30/2022   Medicare Annual Wellness (AWV)  05/11/2023   Fecal DNA (Cologuard)  02/17/2025   Hepatitis C Screening  Completed   HIV Screening  Completed   HPV VACCINES  Aged Out   COLONOSCOPY (Pts 45-1yrs Insurance coverage will need to be confirmed)  Discontinued    Health Maintenance  Health Maintenance Due  Topic Date Due   COVID-19 Vaccine (1) Never done   DTaP/Tdap/Td (1 - Tdap) Never done   Lung Cancer Screening  Never done   Zoster Vaccines- Shingrix (1 of 2) Never done    Colorectal cancer screening: Type of screening: Cologuard. Completed 02/17/22.  Repeat every 3 years- had colonoscopy March 2024  Lung Cancer Screening: (Low Dose CT Chest recommended if Age 73-80 years, 30 pack-year currently smoking OR have quit w/in 15years.) does not qualify.    Additional Screening:  Hepatitis C Screening: does qualify; Completed 02/01/22  Vision Screening: Recommended annual ophthalmology exams for early detection of glaucoma and other disorders of the eye. Is the patient up to date with their annual eye exam?  No  Who is the provider or what is the name of the office in which the patient attends annual eye exams? No one If pt is not established with a provider, would they like to be referred to a provider to establish care? No .   Dental Screening: Recommended annual dental exams for proper oral hygiene  Community Resource Referral / Chronic Care Management: CRR required this visit?  No   CCM required this visit?  No      Plan:     I have personally reviewed and noted the following in the patient's chart:   Medical and social history Use of alcohol, tobacco or illicit drugs  Current medications and supplements including opioid prescriptions. Patient is not currently taking opioid prescriptions. Functional ability and status Nutritional status Physical  activity Advanced directives List of other physicians Hospitalizations, surgeries, and ER visits in previous 12 months Vitals Screenings to include cognitive, depression, and falls Referrals and appointments  In addition, I have reviewed and discussed with patient certain preventive protocols, quality metrics, and best practice recommendations. A written personalized care plan for preventive services as well as general preventive health recommendations were provided to patient.     Hal Hope, LPN   [id number]/99/[id number]   Nurse Notes: none

## 2022-05-11 NOTE — Patient Instructions (Signed)
Mr. Jeremy Turner , Thank you for taking time to come for your Medicare Wellness Visit. I appreciate your ongoing commitment to your health goals. Please review the following plan we discussed and let me know if I can assist you in the future.   These are the goals we discussed:  Goals      DIET - EAT MORE FRUITS AND VEGETABLES        This is a list of the screening recommended for you and due dates:  Health Maintenance  Topic Date Due   COVID-19 Vaccine (1) Never done   DTaP/Tdap/Td vaccine (1 - Tdap) Never done   Screening for Lung Cancer  Never done   Zoster (Shingles) Vaccine (1 of 2) Never done   Flu Shot  08/30/2022   Medicare Annual Wellness Visit  05/11/2023   Cologuard (Stool DNA test)  02/17/2025   Hepatitis C Screening: USPSTF Recommendation to screen - Ages 18-79 yo.  Completed   HIV Screening  Completed   HPV Vaccine  Aged Out   Colon Cancer Screening  Discontinued    Advanced directives: no  Conditions/risks identified: none  Next appointment: Follow up in one year for your annual wellness visit 05/17/23 @ 10:15 am by phone  Preventive Care 40-64 Years, Male Preventive care refers to lifestyle choices and visits with your health care provider that can promote health and wellness. What does preventive care include? A yearly physical exam. This is also called an annual well check. Dental exams once or twice a year. Routine eye exams. Ask your health care provider how often you should have your eyes checked. Personal lifestyle choices, including: Daily care of your teeth and gums. Regular physical activity. Eating a healthy diet. Avoiding tobacco and drug use. Limiting alcohol use. Practicing safe sex. Taking low-dose aspirin every day starting at age 83. What happens during an annual well check? The services and screenings done by your health care provider during your annual well check will depend on your age, overall health, lifestyle risk factors, and family  history of disease. Counseling  Your health care provider may ask you questions about your: Alcohol use. Tobacco use. Drug use. Emotional well-being. Home and relationship well-being. Sexual activity. Eating habits. Work and work Astronomer. Screening  You may have the following tests or measurements: Height, weight, and BMI. Blood pressure. Lipid and cholesterol levels. These may be checked every 5 years, or more frequently if you are over 55 years old. Skin check. Lung cancer screening. You may have this screening every year starting at age 59 if you have a 30-pack-year history of smoking and currently smoke or have quit within the past 15 years. Fecal occult blood test (FOBT) of the stool. You may have this test every year starting at age 47. Flexible sigmoidoscopy or colonoscopy. You may have a sigmoidoscopy every 5 years or a colonoscopy every 10 years starting at age 15. Prostate cancer screening. Recommendations will vary depending on your family history and other risks. Hepatitis C blood test. Hepatitis B blood test. Sexually transmitted disease (STD) testing. Diabetes screening. This is done by checking your blood sugar (glucose) after you have not eaten for a while (fasting). You may have this done every 1-3 years. Discuss your test results, treatment options, and if necessary, the need for more tests with your health care provider. Vaccines  Your health care provider may recommend certain vaccines, such as: Influenza vaccine. This is recommended every year. Tetanus, diphtheria, and acellular pertussis (Tdap, Td) vaccine.  You may need a Td booster every 10 years. Zoster vaccine. You may need this after age 48. Pneumococcal 13-valent conjugate (PCV13) vaccine. You may need this if you have certain conditions and have not been vaccinated. Pneumococcal polysaccharide (PPSV23) vaccine. You may need one or two doses if you smoke cigarettes or if you have certain  conditions. Talk to your health care provider about which screenings and vaccines you need and how often you need them. This information is not intended to replace advice given to you by your health care provider. Make sure you discuss any questions you have with your health care provider. Document Released: 02/11/2015 Document Revised: 10/05/2015 Document Reviewed: 11/16/2014 Elsevier Interactive Patient Education  2017 [place] Prevention in the Home Falls can cause injuries. They can happen to people of all ages. There are many things you can do to make your home safe and to help prevent falls. What can I do on the outside of my home? Regularly fix the edges of walkways and driveways and fix any cracks. Remove anything that might make you trip as you walk through a door, such as a raised step or threshold. Trim any bushes or trees on the path to your home. Use bright outdoor lighting. Clear any walking paths of anything that might make someone trip, such as rocks or tools. Regularly check to see if handrails are loose or broken. Make sure that both sides of any steps have handrails. Any raised decks and porches should have guardrails on the edges. Have any leaves, snow, or ice cleared regularly. Use sand or salt on walking paths during winter. Clean up any spills in your garage right away. This includes oil or grease spills. What can I do in the bathroom? Use night lights. Install grab bars by the toilet and in the tub and shower. Do not use towel bars as grab bars. Use non-skid mats or decals in the tub or shower. If you need to sit down in the shower, use a plastic, non-slip stool. Keep the floor dry. Clean up any water that spills on the floor as soon as it happens. Remove soap buildup in the tub or shower regularly. Attach bath mats securely with double-sided non-slip rug tape. Do not have throw rugs and other things on the floor that can make you trip. What can I do in  the bedroom? Use night lights. Make sure that you have a light by your bed that is easy to reach. Do not use any sheets or blankets that are too big for your bed. They should not hang down onto the floor. Have a firm chair that has side arms. You can use this for support while you get dressed. Do not have throw rugs and other things on the floor that can make you trip. What can I do in the kitchen? Clean up any spills right away. Avoid walking on wet floors. Keep items that you use a lot in easy-to-reach places. If you need to reach something above you, use a strong step stool that has a grab bar. Keep electrical cords out of the way. Do not use floor polish or wax that makes floors slippery. If you must use wax, use non-skid floor wax. Do not have throw rugs and other things on the floor that can make you trip. What can I do with my stairs? Do not leave any items on the stairs. Make sure that there are handrails on both sides of the stairs and  use them. Fix handrails that are broken or loose. Make sure that handrails are as long as the stairways. Check any carpeting to make sure that it is firmly attached to the stairs. Fix any carpet that is loose or worn. Avoid having throw rugs at the top or bottom of the stairs. If you do have throw rugs, attach them to the floor with carpet tape. Make sure that you have a light switch at the top of the stairs and the bottom of the stairs. If you do not have them, ask someone to add them for you. What else can I do to help prevent falls? Wear shoes that: Do not have high heels. Have rubber bottoms. Are comfortable and fit you well. Are closed at the toe. Do not wear sandals. If you use a stepladder: Make sure that it is fully opened. Do not climb a closed stepladder. Make sure that both sides of the stepladder are locked into place. Ask someone to hold it for you, if possible. Clearly mark and make sure that you can see: Any grab bars or  handrails. First and last steps. Where the edge of each step is. Use tools that help you move around (mobility aids) if they are needed. These include: Canes. Walkers. Scooters. Crutches. Turn on the lights when you go into a dark area. Replace any light bulbs as soon as they burn out. Set up your furniture so you have a clear path. Avoid moving your furniture around. If any of your floors are uneven, fix them. If there are any pets around you, be aware of where they are. Review your medicines with your doctor. Some medicines can make you feel dizzy. This can increase your chance of falling. Ask your doctor what other things that you can do to help prevent falls. This information is not intended to replace advice given to you by your health care provider. Make sure you discuss any questions you have with your health care provider. Document Released: 11/11/2008 Document Revised: 06/23/2015 Document Reviewed: 02/19/2014 Elsevier Interactive Patient Education  2017 [employer].

## 2022-05-20 ENCOUNTER — Other Ambulatory Visit: Payer: Self-pay | Admitting: Internal Medicine

## 2022-05-21 NOTE — Telephone Encounter (Signed)
Requested Prescriptions  Pending Prescriptions Disp Refills   ELIQUIS 5 MG TABS tablet [Pharmacy Med Name: ELIQUIS 5 MG TABLET] 180 tablet 0    Sig: TAKE 1 TABLET BY MOUTH TWICE A DAY AS DIRECTED     Hematology:  Anticoagulants - apixaban Passed - 05/20/2022 10:27 AM      Passed - PLT in normal range and within 360 days    Platelets  Date Value Ref Range Status  10/30/2021 244 140 - 400 Thousand/uL Final         Passed - HGB in normal range and within 360 days    Hemoglobin  Date Value Ref Range Status  10/30/2021 16.5 13.2 - 17.1 g/dL Final         Passed - HCT in normal range and within 360 days    HCT  Date Value Ref Range Status  10/30/2021 47.7 38.5 - 50.0 % Final         Passed - Cr in normal range and within 360 days    Creat  Date Value Ref Range Status  10/30/2021 0.93 0.70 - 1.30 mg/dL Final         Passed - AST in normal range and within 360 days    AST  Date Value Ref Range Status  10/30/2021 18 10 - 35 U/L Final         Passed - ALT in normal range and within 360 days    ALT  Date Value Ref Range Status  10/30/2021 20 9 - 46 U/L Final         Passed - Valid encounter within last 12 months    Recent Outpatient Visits           3 months ago Encounter for routine adult medical exam with abnormal findings   [facility] [place], Salvadore Oxford, NP   6 months ago Preoperative clearance   [facility] [place], Salvadore Oxford, NP   7 months ago Direct left inguinal hernia   [facility] [place], [place] W, NP   11 months ago Spinal stenosis of lumbar region without neurogenic claudication   [facility] [place], Salvadore Oxford, [place]

## 2022-06-03 ENCOUNTER — Other Ambulatory Visit: Payer: Self-pay | Admitting: Internal Medicine

## 2022-06-05 NOTE — Telephone Encounter (Signed)
Requested medications are due for refill today.  Provider to decide  Requested medications are on the active medications list.  yes  Last refill. 05/07/2022 #60 0 rf  Future visit scheduled.   no  Notes to clinic.  Refill not delegated    Requested Prescriptions  Pending Prescriptions Disp Refills   traMADol (ULTRAM) 50 MG tablet [Pharmacy Med Name: TRAMADOL HCL 50 MG TABLET] 60 tablet 0    Sig: TAKE 1 TABLET BY MOUTH TWICE A DAY AS NEEDED     Not Delegated - Analgesics:  Opioid Agonists Failed - 06/03/2022  7:24 PM      Failed - This refill cannot be delegated      Failed - Urine Drug Screen completed in last 360 days      Failed - Valid encounter within last 3 months    Recent Outpatient Visits           4 months ago Encounter for routine adult medical exam with abnormal findings   [facility] [place], Salvadore Oxford, NP   7 months ago Preoperative clearance   [facility] [place], Salvadore Oxford, NP   8 months ago Direct left inguinal hernia   [place] [place], [place] W, NP   1 year ago Spinal stenosis of lumbar region without neurogenic claudication   [place] [facility] [employer], Salvadore Oxford, NP              Signed Prescriptions Disp Refills   metoprolol succinate (TOPROL-XL) 25 MG 24 hr tablet 180 tablet 0    Sig: TAKE 1 TABLET BY MOUTH TWICE A DAY     Cardiovascular:  Beta Blockers Passed - 06/03/2022  7:24 PM      Passed - Last BP in normal range    BP Readings from Last 1 Encounters:  05/11/22 120/78         Passed - Last Heart Rate in normal range    Pulse Readings from Last 1 Encounters:  04/11/22 73         Passed - Valid encounter within last 6 months    Recent Outpatient Visits           4 months ago Encounter for routine adult medical exam with abnormal findings   [place] [facility] [place], Salvadore Oxford, NP   7 months ago Preoperative clearance    [place] [facility] [place], Salvadore Oxford, NP   8 months ago Direct left inguinal hernia   [facility] [place], [place], NP   1 year ago Spinal stenosis of lumbar region without neurogenic claudication   [facility] [place], Salvadore Oxford, [place]

## 2022-06-05 NOTE — Telephone Encounter (Signed)
Requested Prescriptions  Pending Prescriptions Disp Refills   traMADol (ULTRAM) 50 MG tablet [Pharmacy Med Name: TRAMADOL HCL 50 MG TABLET] 60 tablet 0    Sig: TAKE 1 TABLET BY MOUTH TWICE A DAY AS NEEDED     Not Delegated - Analgesics:  Opioid Agonists Failed - 06/03/2022  7:24 PM      Failed - This refill cannot be delegated      Failed - Urine Drug Screen completed in last 360 days      Failed - Valid encounter within last 3 months    Recent Outpatient Visits           4 months ago Encounter for routine adult medical exam with abnormal findings   [place] [facility] [place], Salvadore Oxford, NP   7 months ago Preoperative clearance   [place] [facility] [place], Salvadore Oxford, NP   8 months ago Direct left inguinal hernia   [place] [facility] [place], [place], NP   1 year ago Spinal stenosis of lumbar region without neurogenic claudication   [facility] [place], [place] W, NP               metoprolol succinate (TOPROL-XL) 25 MG 24 hr tablet [Pharmacy Med Name: METOPROLOL SUCC ER 25 MG TAB] 180 tablet 0    Sig: TAKE 1 TABLET BY MOUTH TWICE A DAY     Cardiovascular:  Beta Blockers Passed - 06/03/2022  7:24 PM      Passed - Last BP in normal range    BP Readings from Last 1 Encounters:  05/11/22 120/78         Passed - Last Heart Rate in normal range    Pulse Readings from Last 1 Encounters:  04/11/22 73         Passed - Valid encounter within last 6 months    Recent Outpatient Visits           4 months ago Encounter for routine adult medical exam with abnormal findings   [place] [facility] [place], Salvadore Oxford, NP   7 months ago Preoperative clearance   [place] [facility] [place], Salvadore Oxford, NP   8 months ago Direct left inguinal hernia    [facility] [place], [place], NP   1 year ago Spinal stenosis of lumbar region without neurogenic  claudication   [facility] [place], Salvadore Oxford, [place]

## 2022-07-09 ENCOUNTER — Other Ambulatory Visit: Payer: Self-pay | Admitting: Internal Medicine

## 2022-07-10 NOTE — Telephone Encounter (Signed)
Requested medications are due for refill today.  yes  Requested medications are on the active medications list.  yes  Last refill. 06/05/2022 #60 0 rf  Future visit scheduled.   no  Notes to clinic.  Refill not delegated.    Requested Prescriptions  Pending Prescriptions Disp Refills   traMADol (ULTRAM) 50 MG tablet [Pharmacy Med Name: TRAMADOL HCL 50 MG TABLET] 60 tablet 0    Sig: TAKE 1 TABLET BY MOUTH TWICE A DAY AS NEEDED     Not Delegated - Analgesics:  Opioid Agonists Failed - 07/09/2022  7:20 AM      Failed - This refill cannot be delegated      Failed - Urine Drug Screen completed in last 360 days      Failed - Valid encounter within last 3 months    Recent Outpatient Visits           5 months ago Encounter for routine adult medical exam with abnormal findings   [place] [facility] [place], Salvadore Oxford, NP   8 months ago Preoperative clearance   [place] [facility] [place], Salvadore Oxford, NP   9 months ago Direct left inguinal hernia   [place] [facility] [place], [place], NP   1 year ago Spinal stenosis of lumbar region without neurogenic claudication   [facility] [place], Salvadore Oxford, NP

## 2022-08-07 ENCOUNTER — Other Ambulatory Visit: Payer: Self-pay | Admitting: Internal Medicine

## 2022-08-07 NOTE — Telephone Encounter (Signed)
Requested medication (s) are due for refill today: routing for review  Requested medication (s) are on the active medication list: yes  Last refill:  07/10/22  Future visit scheduled: no  Notes to clinic:  Unable to refill per protocol, cannot delegate.      Requested Prescriptions  Pending Prescriptions Disp Refills   traMADol (ULTRAM) 50 MG tablet [Pharmacy Med Name: TRAMADOL HCL 50 MG TABLET] 60 tablet 0    Sig: TAKE 1 TABLET BY MOUTH TWICE A DAY AS NEEDED     Not Delegated - Analgesics:  Opioid Agonists Failed - 08/07/2022 11:28 AM      Failed - This refill cannot be delegated      Failed - Urine Drug Screen completed in last 360 days      Failed - Valid encounter within last 3 months    Recent Outpatient Visits           6 months ago Encounter for routine adult medical exam with abnormal findings   [place] [facility] [place], Salvadore Oxford, NP   9 months ago Preoperative clearance   [place] [facility] [place], Salvadore Oxford, NP   10 months ago Direct left inguinal hernia   [place] [facility] [place], [place], NP   1 year ago Spinal stenosis of lumbar region without neurogenic claudication   [facility] [place], Salvadore Oxford, NP

## 2022-08-18 ENCOUNTER — Other Ambulatory Visit: Payer: Self-pay | Admitting: Internal Medicine

## 2022-08-20 NOTE — Telephone Encounter (Signed)
Requested Prescriptions  Pending Prescriptions Disp Refills   ELIQUIS 5 MG TABS tablet [Pharmacy Med Name: ELIQUIS 5 MG TABLET] 180 tablet 0    Sig: TAKE 1 TABLET BY MOUTH TWICE A DAY AS DIRECTED     Hematology:  Anticoagulants - apixaban Passed - 08/18/2022 10:54 AM      Passed - PLT in normal range and within 360 days    Platelets  Date Value Ref Range Status  10/30/2021 244 140 - 400 Thousand/uL Final         Passed - HGB in normal range and within 360 days    Hemoglobin  Date Value Ref Range Status  10/30/2021 16.5 13.2 - 17.1 g/dL Final         Passed - HCT in normal range and within 360 days    HCT  Date Value Ref Range Status  10/30/2021 47.7 38.5 - 50.0 % Final         Passed - Cr in normal range and within 360 days    Creat  Date Value Ref Range Status  10/30/2021 0.93 0.70 - 1.30 mg/dL Final         Passed - AST in normal range and within 360 days    AST  Date Value Ref Range Status  10/30/2021 18 10 - 35 U/L Final         Passed - ALT in normal range and within 360 days    ALT  Date Value Ref Range Status  10/30/2021 20 9 - 46 U/L Final         Passed - Valid encounter within last 12 months    Recent Outpatient Visits           6 months ago Encounter for routine adult medical exam with abnormal findings   [facility] [place], Salvadore Oxford, NP   9 months ago Preoperative clearance   [place] [facility] [place], Salvadore Oxford, NP   10 months ago Direct left inguinal hernia   [place] [facility] [place], [place], NP   1 year ago Spinal stenosis of lumbar region without neurogenic claudication   [facility] [place], Salvadore Oxford, NP

## 2022-08-31 ENCOUNTER — Other Ambulatory Visit: Payer: Self-pay | Admitting: Internal Medicine

## 2022-08-31 NOTE — Telephone Encounter (Signed)
Requested Prescriptions  Pending Prescriptions Disp Refills   metoprolol succinate (TOPROL-XL) 25 MG 24 hr tablet [Pharmacy Med Name: METOPROLOL SUCC ER 25 MG TAB] 180 tablet 0    Sig: TAKE 1 TABLET BY MOUTH TWICE A DAY     Cardiovascular:  Beta Blockers Failed - 08/31/2022  2:37 AM      Failed - Valid encounter within last 6 months    Recent Outpatient Visits           7 months ago Encounter for routine adult medical exam with abnormal findings   [place] [facility] [place], Salvadore Oxford, NP   10 months ago Preoperative clearance   [place] [facility] [place], Salvadore Oxford, NP   10 months ago Direct left inguinal hernia   [place] [facility] [place], [place], NP   1 year ago Spinal stenosis of lumbar region without neurogenic claudication   [place] [facility] [place], [place] W, NP              Passed - Last BP in normal range    BP Readings from Last 1 Encounters:  05/11/22 120/78         Passed - Last Heart Rate in normal range    Pulse Readings from Last 1 Encounters:  04/11/22 73          TRELEGY ELLIPTA 100-62.5-25 MCG/ACT AEPB [Pharmacy Med Name: TRELEGY ELLIPTA 100-62.5-25] 60 each 0    Sig: INHALE 1 PUFF BY MOUTH EVERY DAY     Off-Protocol Failed - 08/31/2022  2:37 AM      Failed - Medication not assigned to a protocol, review manually.      Passed - Valid encounter within last 12 months    Recent Outpatient Visits           7 months ago Encounter for routine adult medical exam with abnormal findings   [place] [facility] [place], Salvadore Oxford, NP   10 months ago Preoperative clearance   [place] [facility] [place], Salvadore Oxford, NP   10 months ago Direct left inguinal hernia   [place] [facility] [place], [place], NP   1 year ago Spinal stenosis of lumbar region without neurogenic claudication   [facility] [place],  Salvadore Oxford, [place]

## 2022-09-07 ENCOUNTER — Other Ambulatory Visit: Payer: Self-pay | Admitting: Internal Medicine

## 2022-09-07 NOTE — Telephone Encounter (Signed)
Requested medication (s) are due for refill today: Due 09/08/22  Requested medication (s) are on the active medication list: yes  Last refill: 08/08/22   #60  0 refills  Future visit scheduled yes 05/17/23  Notes to clinic: Not delegated, please review. Thank you.  Requested Prescriptions  Pending Prescriptions Disp Refills   traMADol (ULTRAM) 50 MG tablet [Pharmacy Med Name: TRAMADOL HCL 50 MG TABLET] 60 tablet 0    Sig: TAKE 1 TABLET BY MOUTH TWICE A DAY AS NEEDED     Not Delegated - Analgesics:  Opioid Agonists Failed - 09/07/2022  9:34 AM      Failed - This refill cannot be delegated      Failed - Urine Drug Screen completed in last 360 days      Failed - Valid encounter within last 3 months    Recent Outpatient Visits           7 months ago Encounter for routine adult medical exam with abnormal findings   [place] [facility] [place], Salvadore Oxford, NP   10 months ago Preoperative clearance   [place] [facility] [place], Salvadore Oxford, NP   11 months ago Direct left inguinal hernia   [place] [facility] [place], [place], NP   1 year ago Spinal stenosis of lumbar region without neurogenic claudication   [facility] [place], Salvadore Oxford, NP

## 2022-09-10 NOTE — Telephone Encounter (Signed)
Pt is calling to check on the status of medication. Please advise [phone]

## 2022-09-11 ENCOUNTER — Other Ambulatory Visit: Payer: Self-pay | Admitting: Internal Medicine

## 2022-09-12 ENCOUNTER — Telehealth: Payer: Self-pay | Admitting: Internal Medicine

## 2022-09-12 NOTE — Telephone Encounter (Signed)
Medication Refill - Medication: traMADol (ULTRAM) 50 MG tablet   Has the patient contacted their pharmacy? Yes.   Pt told to contact provider (Agent: If no, request that the patient contact the pharmacy for the refill. If patient does not wish to contact the pharmacy document the reason why and proceed with request.) (Agent: If yes, when and what did the pharmacy advise?)  Preferred Pharmacy (with phone number or street name):  [phone] [place] - [address] [employer] Phone: [phone]  Fax: [phone]     Has the patient been seen for an appointment in the last year OR does the patient have an upcoming appointment? Yes.    Agent: Please be advised that RX refills may take up to 3 business days. We ask that you follow-up with your pharmacy.

## 2022-09-13 NOTE — Telephone Encounter (Signed)
Requested medication (s) are due for refill today - no  Requested medication (s) are on the active medication list -yes  Future visit scheduled -no  Last refill: 08/08/22 #60  Notes to clinic: duplicate request- non delegated Rx  Requested Prescriptions  Pending Prescriptions Disp Refills   traMADol (ULTRAM) 50 MG tablet 60 tablet 0    Sig: Take 1 tablet (50 mg total) by mouth 2 (two) times daily as needed.     Not Delegated - Analgesics:  Opioid Agonists Failed - 09/12/2022 10:13 AM      Failed - This refill cannot be delegated      Failed - Urine Drug Screen completed in last 360 days      Failed - Valid encounter within last 3 months    Recent Outpatient Visits           7 months ago Encounter for routine adult medical exam with abnormal findings   [place] [facility] [place], Salvadore Oxford, NP   10 months ago Preoperative clearance   [place] [facility] [place], Salvadore Oxford, NP   11 months ago Direct left inguinal hernia   [place] [facility] [place], [place], NP   1 year ago Spinal stenosis of lumbar region without neurogenic claudication   [place] [facility] [place], Salvadore Oxford, NP                 Requested Prescriptions  Pending Prescriptions Disp Refills   traMADol (ULTRAM) 50 MG tablet 60 tablet 0    Sig: Take 1 tablet (50 mg total) by mouth 2 (two) times daily as needed.     Not Delegated - Analgesics:  Opioid Agonists Failed - 09/12/2022 10:13 AM      Failed - This refill cannot be delegated      Failed - Urine Drug Screen completed in last 360 days      Failed - Valid encounter within last 3 months    Recent Outpatient Visits           7 months ago Encounter for routine adult medical exam with abnormal findings   [place] [facility] [place], Salvadore Oxford, NP   10 months ago Preoperative clearance   [place] [facility] [place], Salvadore Oxford, NP   11 months  ago Direct left inguinal hernia   [place] [facility] [place], [place], NP   1 year ago Spinal stenosis of lumbar region without neurogenic claudication   [facility] [place], Salvadore Oxford, NP

## 2022-09-13 NOTE — Telephone Encounter (Signed)
Requested medication (s) are due for refill today- no  Requested medication (s) are on the active medication list -yes  Future visit scheduled -no  Last refill: 08/08/22 #60  Notes to clinic: non delegated Rx- duplicate request  Requested Prescriptions  Pending Prescriptions Disp Refills   traMADol (ULTRAM) 50 MG tablet [Pharmacy Med Name: TRAMADOL HCL 50 MG TABLET] 60 tablet 0    Sig: TAKE 1 TABLET BY MOUTH TWICE A DAY AS NEEDED     Not Delegated - Analgesics:  Opioid Agonists Failed - 09/11/2022  7:12 PM      Failed - This refill cannot be delegated      Failed - Urine Drug Screen completed in last 360 days      Failed - Valid encounter within last 3 months    Recent Outpatient Visits           7 months ago Encounter for routine adult medical exam with abnormal findings   [place] [facility] [place], Salvadore Oxford, NP   10 months ago Preoperative clearance   [place] [facility] [place], Salvadore Oxford, NP   11 months ago Direct left inguinal hernia   [place] [facility] [place], [place], NP   1 year ago Spinal stenosis of lumbar region without neurogenic claudication   [place], Salvadore Oxford, NP                 Requested Prescriptions  Pending Prescriptions Disp Refills   traMADol (ULTRAM) 50 MG tablet [Pharmacy Med Name: TRAMADOL HCL 50 MG TABLET] 60 tablet 0    Sig: TAKE 1 TABLET BY MOUTH TWICE A DAY AS NEEDED     Not Delegated - Analgesics:  Opioid Agonists Failed - 09/11/2022  7:12 PM      Failed - This refill cannot be delegated      Failed - Urine Drug Screen completed in last 360 days      Failed - Valid encounter within last 3 months    Recent Outpatient Visits           7 months ago Encounter for routine adult medical exam with abnormal findings   [place] [place], Salvadore Oxford, NP   10 months ago Preoperative clearance   [facility] [place], Salvadore Oxford, NP   11 months ago Direct left inguinal hernia   [place] [facility] [place], [place], NP   1 year ago Spinal stenosis of lumbar region without neurogenic claudication   [facility] [place], Salvadore Oxford, NP

## 2022-09-25 ENCOUNTER — Ambulatory Visit: Payer: Medicare Other | Admitting: Internal Medicine

## 2022-09-25 NOTE — Progress Notes (Deleted)
Subjective:    Patient ID: Jeremy Turner, male    DOB: September 05, 1965, 57 y.o.   MRN: [id number]  HPI  Patient presents the clinic today for follow-up of chronic conditions.  Lumbar spinal stenosis: Status post laminectomy 09/2020.  Managed with tramadol.  He does not follow with neurosurgery or pain management.  COPD: He denies chronic cough or shortness of breath.  He uses trelegy and albuterol as needed with good relief of symptoms.  There are no PFTs on file.  He does continue to smoke.  HLD: His last LDL was 94, triglycerides 272, 01/2022.  He denies myalgias on atorvastatin.  He tries to consume low-fat diet.  A-fib: Chronic, managed on metoprolol and eliquis.  He follows with cardiology.  ECG from 01/2022 reviewed.  Review of Systems    Past Medical History:  Diagnosis Date   Aortic atherosclerosis (HCC)    Bilateral inguinal hernia    CAD (coronary artery disease) 03/2019   a.) LHC (done in [place] 30% LCx disease - med mgmt.   Cardiac murmur    COPD (chronic obstructive pulmonary disease) (HCC)    DDD (degenerative disc disease), lumbar    HLD (hyperlipidemia)    Long term current use of anticoagulant    a.) apixaban   Marijuana use    a.) UDS (+) for [facility] 05/2021   PAF (paroxysmal atrial fibrillation) (HCC)    a.) CHA2DS2VASc = 1 (vascular disease history);  b.) rate/rhythm maintained on oral metoprolol succinate; chronically anticoagulated with apixaban   Stenosis of lateral recess of lumbar spine    a.) s/p laminectomy with L3-L5 posterior and interbody fusion   Uncomplicated opioid use    a.) long term Tramadol Rx'd by PCP; signed OUA on file    Current Outpatient Medications  Medication Sig Dispense Refill   acetaminophen (TYLENOL) 500 MG tablet Take 2 tablets (1,000 mg total) by mouth every 6 (six) hours as needed for mild pain.     albuterol (PROVENTIL) (2.5 MG/3ML) 0.083% nebulizer solution SMARTSIG:1 Vial(s) Via Nebulizer 4 Times Daily PRN     albuterol  (VENTOLIN HFA) 108 (90 Base) MCG/ACT inhaler SMARTSIG:2 Puff(s) Via Inhaler 3 Times Daily PRN     aspirin EC 81 MG tablet Take by mouth. (Patient not taking: Reported on 05/11/2022)     atorvastatin (LIPITOR) 20 MG tablet Take 1 tablet (20 mg total) by mouth daily. 30 tablet 8   diclofenac Sodium (VOLTAREN) 1 % GEL Apply 2 g topically 4 (four) times daily.     ELIQUIS 5 MG TABS tablet TAKE 1 TABLET BY MOUTH TWICE A DAY AS DIRECTED 180 tablet 0   metoprolol succinate (TOPROL-XL) 25 MG 24 hr tablet TAKE 1 TABLET BY MOUTH TWICE A DAY 180 tablet 0   traMADol (ULTRAM) 50 MG tablet TAKE 1 TABLET BY MOUTH TWICE A DAY AS NEEDED 60 tablet 0   TRELEGY ELLIPTA 100-62.5-25 MCG/ACT AEPB INHALE 1 PUFF BY MOUTH EVERY DAY 60 each 0   No current facility-administered medications for this visit.    Allergies  Allergen Reactions   Cefadroxil Hives and Palpitations    Family History  Problem Relation Age of Onset   Heart disease Father    Healthy Sister    Healthy Brother    Colon cancer Neg Hx    Prostate cancer Neg Hx     Social History   Socioeconomic History   Marital status: Significant Other    Spouse name: Harriett Sine   Number of children: Not  on file   Years of education: Not on file   Highest education level: Some college, no degree  Occupational History   Not on file  Tobacco Use   Smoking status: Every Day    Current packs/day: 0.10    Types: Cigarettes    Passive exposure: Past   Smokeless tobacco: Never   Tobacco comments:    3-4 cigarettes per day current 11-02-21  Vaping Use   Vaping status: Never Used  Substance and Sexual Activity   Alcohol use: Not Currently   Drug use: Not Currently    Types: Marijuana    Comment: daily   Sexual activity: Not on file  Other Topics Concern   Not on file  Social History Narrative   Not on file   Social Determinants of Health   Financial Resource Strain: High Risk (09/23/2022)   Overall Financial Resource Strain (CARDIA)    Difficulty  of Paying Living Expenses: Hard  Food Insecurity: No Food Insecurity (09/23/2022)   Hunger Vital Sign    Worried About Running Out of Food in the Last Year: Never true    Ran Out of Food in the Last Year: Never true  Transportation Needs: No Transportation Needs (09/23/2022)   PRAPARE - Administrator, Civil Service (Medical): No    Lack of Transportation (Non-Medical): No  Physical Activity: Insufficiently Active (09/23/2022)   Exercise Vital Sign    Days of Exercise per Week: 7 days    Minutes of Exercise per Session: 10 min  Stress: No Stress Concern Present (09/23/2022)   [employer] of Occupational Health - Occupational Stress Questionnaire    Feeling of Stress : Not at all  Social Connections: Moderately Integrated (09/23/2022)   Social Connection and Isolation Panel [NHANES]    Frequency of Communication with Friends and Family: More than three times a week    Frequency of Social Gatherings with Friends and Family: Patient declined    Attends Religious Services: Never    Database administrator or Organizations: Yes    Attends Engineer, structural: More than 4 times per year    Marital Status: Living with partner  Intimate Partner Violence: Not At Risk (05/11/2022)   Humiliation, Afraid, Rape, and Kick questionnaire    Fear of Current or Ex-Partner: No    Emotionally Abused: No    Physically Abused: No    Sexually Abused: No     Constitutional: Denies fever, malaise, fatigue, headache or abrupt weight changes.  HEENT: Denies eye pain, eye redness, ear pain, ringing in the ears, wax buildup, runny nose, nasal congestion, bloody nose, or sore throat. Respiratory: Denies difficulty breathing, shortness of breath, cough or sputum production.   Cardiovascular: Denies chest pain, chest tightness, palpitations or swelling in the hands or feet.  Gastrointestinal: Denies abdominal pain, bloating, constipation, diarrhea or blood in the stool.  GU: Denies urgency,  frequency, pain with urination, burning sensation, blood in urine, odor or discharge. Musculoskeletal: Patient reports chronic back pain.  Denies decrease in range of motion, difficulty with gait, muscle pain or joint swelling.  Skin: Denies redness, rashes, lesions or ulcercations.  Neurological: Denies dizziness, difficulty with memory, difficulty with speech or problems with balance and coordination.  Psych: Denies anxiety, depression, SI/HI.  No other specific complaints in a complete review of systems (except as listed in HPI above).   Objective:   Physical Exam  There were no vitals taken for this visit. Wt Readings from Last 3 Encounters:  05/11/22 186 lb (84.4 kg)  04/11/22 189 lb (85.7 kg)  02/02/22 186 lb 8 oz (84.6 kg)    General: Appears their stated age, well developed, well nourished in NAD. Skin: Warm, dry and intact. No rashes, lesions or ulcerations noted. HEENT: Head: normal shape and size; Eyes: sclera white, no icterus, conjunctiva pink, PERRLA and EOMs intact; Ears: Tm's gray and intact, normal light reflex; Nose: mucosa pink and moist, septum midline; Throat/Mouth: Teeth present, mucosa pink and moist, no exudate, lesions or ulcerations noted.  Neck:  Neck supple, trachea midline. No masses, lumps or thyromegaly present.  Cardiovascular: Normal rate and rhythm. S1,S2 noted.  No murmur, rubs or gallops noted. No JVD or BLE edema. No carotid bruits noted. Pulmonary/Chest: Normal effort and positive vesicular breath sounds. No respiratory distress. No wheezes, rales or ronchi noted.  Abdomen: Soft and nontender. Normal bowel sounds. No distention or masses noted. Liver, spleen and kidneys non palpable. Musculoskeletal: Normal range of motion. No signs of joint swelling. No difficulty with gait.  Neurological: Alert and oriented. Cranial nerves II-XII grossly intact. Coordination normal.  Psychiatric: Mood and affect normal. Behavior is normal. Judgment and thought  content normal.    BMET    Component Value Date/Time   NA 141 10/30/2021 0954   K 4.8 10/30/2021 0954   CL 105 10/30/2021 0954   CO2 27 10/30/2021 0954   GLUCOSE 99 10/30/2021 0954   BUN 15 10/30/2021 0954   CREATININE 0.93 10/30/2021 0954   CALCIUM 9.9 10/30/2021 0954    Lipid Panel     Component Value Date/Time   CHOL 175 02/01/2022 0951   TRIG 117 02/01/2022 0951   HDL 60 02/01/2022 0951   CHOLHDL 2.9 02/01/2022 0951   LDLCALC 94 02/01/2022 0951    CBC    Component Value Date/Time   WBC 6.8 10/30/2021 0954   RBC 5.57 10/30/2021 0954   HGB 16.5 10/30/2021 0954   HCT 47.7 10/30/2021 0954   PLT 244 10/30/2021 0954   MCV 85.6 10/30/2021 0954   MCH 29.6 10/30/2021 0954   MCHC 34.6 10/30/2021 0954   RDW 12.3 10/30/2021 0954    Hgb A1C Lab Results  Component Value Date   HGBA1C 5.4 10/30/2021            Assessment & Plan:   RTC in 5 months for your annual exam Nicki Reaper, NP

## 2022-09-27 ENCOUNTER — Other Ambulatory Visit: Payer: Self-pay | Admitting: Internal Medicine

## 2022-09-28 NOTE — Telephone Encounter (Signed)
Requested medication (s) are due for refill today: Yes  Requested medication (s) are on the active medication list: Yes  Last refill:  08/31/22  Future visit scheduled: Yes  Notes to clinic:  Manual review.    Requested Prescriptions  Pending Prescriptions Disp Refills   TRELEGY ELLIPTA 100-62.5-25 MCG/ACT AEPB [Pharmacy Med Name: TRELEGY ELLIPTA 100-62.5-25] 60 each 0    Sig: INHALE 1 PUFF BY MOUTH EVERY DAY     Off-Protocol Failed - 09/27/2022  2:44 AM      Failed - Medication not assigned to a protocol, review manually.      Passed - Valid encounter within last 12 months    Recent Outpatient Visits           7 months ago Encounter for routine adult medical exam with abnormal findings   [place] [facility] [place], Salvadore Oxford, NP   11 months ago Preoperative clearance   [place] [facility], Salvadore Oxford, NP   11 months ago Direct left inguinal hernia   [place] [facility] [place], [place], NP   1 year ago Spinal stenosis of lumbar region without neurogenic claudication   [place] [facility] [place], Salvadore Oxford, NP       Future Appointments             In 6 days [place] Si, Salvadore Oxford, NP [place] [facility], [facility]

## 2022-10-04 ENCOUNTER — Ambulatory Visit (INDEPENDENT_AMBULATORY_CARE_PROVIDER_SITE_OTHER): Payer: Medicare Other | Admitting: Internal Medicine

## 2022-10-04 ENCOUNTER — Encounter: Payer: Self-pay | Admitting: Internal Medicine

## 2022-10-04 VITALS — BP 132/84 | HR 89 | Temp 96.8°F | Wt 193.0 lb

## 2022-10-04 DIAGNOSIS — R7309 Other abnormal glucose: Secondary | ICD-10-CM | POA: Diagnosis not present

## 2022-10-04 DIAGNOSIS — Z79899 Other long term (current) drug therapy: Secondary | ICD-10-CM | POA: Diagnosis not present

## 2022-10-04 DIAGNOSIS — E782 Mixed hyperlipidemia: Secondary | ICD-10-CM

## 2022-10-04 DIAGNOSIS — Z6828 Body mass index (BMI) 28.0-28.9, adult: Secondary | ICD-10-CM

## 2022-10-04 DIAGNOSIS — J410 Simple chronic bronchitis: Secondary | ICD-10-CM

## 2022-10-04 DIAGNOSIS — I4811 Longstanding persistent atrial fibrillation: Secondary | ICD-10-CM

## 2022-10-04 DIAGNOSIS — M48061 Spinal stenosis, lumbar region without neurogenic claudication: Secondary | ICD-10-CM

## 2022-10-04 DIAGNOSIS — E663 Overweight: Secondary | ICD-10-CM

## 2022-10-04 MED ORDER — TRAMADOL HCL 50 MG PO TABS: 50.0000 mg | ORAL_TABLET | Freq: Two times a day (BID) | ORAL | 0 refills | Status: DC | PRN

## 2022-10-04 NOTE — Assessment & Plan Note (Signed)
C-Met and lipid profile today Encouraged him to consume a low-fat diet Continue atorvastatin 

## 2022-10-04 NOTE — Assessment & Plan Note (Signed)
Encouraged regular stretching and core strengthening Continue diclofenac gel and tramadol

## 2022-10-04 NOTE — Assessment & Plan Note (Signed)
Continue metoprolol and Eliquis He will continue to follow with cardiology

## 2022-10-04 NOTE — Progress Notes (Signed)
Subjective:    Patient ID: Jeremy Turner, male    DOB: May 05, 1965, 57 y.o.   MRN: [id number]  HPI  Patient presents to clinic today for follow-up of chronic conditions.  Lumbar spinal stenosis: Status post laminectomy in 2022.  He is using diclofenac gel, and tramadol as prescribed.  He does not follow with pain management.  COPD: He denies chronic cough or shortness of breath.  He is using trelegy and albuterol as prescribed.  There are no PFTs on file.  He smokes occasionally.  HLD: His last LDL was 94, triglycerides 562, 01/2022.  He denies myalgias on atorvastatin.  He tries to consume a low-fat diet.  A-fib: Managed with metoprolol and eliquis.  ECG from 01/2022 reviewed.  He follows with cardiology.  Review of Systems     Past Medical History:  Diagnosis Date   Aortic atherosclerosis (HCC)    Bilateral inguinal hernia    CAD (coronary artery disease) 03/2019   a.) LHC (done in [place]): 30% LCx disease - med mgmt.   Cardiac murmur    COPD (chronic obstructive pulmonary disease) (HCC)    DDD (degenerative disc disease), lumbar    HLD (hyperlipidemia)    Long term current use of anticoagulant    a.) apixaban   Marijuana use    a.) UDS (+) for [facility] 05/2021   PAF (paroxysmal atrial fibrillation) (HCC)    a.) CHA2DS2VASc = 1 (vascular disease history);  b.) rate/rhythm maintained on oral metoprolol succinate; chronically anticoagulated with apixaban   Stenosis of lateral recess of lumbar spine    a.) s/p laminectomy with L3-L5 posterior and interbody fusion   Uncomplicated opioid use    a.) long term Tramadol Rx'd by PCP; signed OUA on file    Current Outpatient Medications  Medication Sig Dispense Refill   acetaminophen (TYLENOL) 500 MG tablet Take 2 tablets (1,000 mg total) by mouth every 6 (six) hours as needed for mild pain.     albuterol (PROVENTIL) (2.5 MG/3ML) 0.083% nebulizer solution SMARTSIG:1 Vial(s) Via Nebulizer 4 Times Daily PRN     albuterol (VENTOLIN HFA)  108 (90 Base) MCG/ACT inhaler SMARTSIG:2 Puff(s) Via Inhaler 3 Times Daily PRN     aspirin EC 81 MG tablet Take by mouth. (Patient not taking: Reported on 05/11/2022)     atorvastatin (LIPITOR) 20 MG tablet Take 1 tablet (20 mg total) by mouth daily. 30 tablet 8   diclofenac Sodium (VOLTAREN) 1 % GEL Apply 2 g topically 4 (four) times daily.     ELIQUIS 5 MG TABS tablet TAKE 1 TABLET BY MOUTH TWICE A DAY AS DIRECTED 180 tablet 0   metoprolol succinate (TOPROL-XL) 25 MG 24 hr tablet TAKE 1 TABLET BY MOUTH TWICE A DAY 180 tablet 0   traMADol (ULTRAM) 50 MG tablet TAKE 1 TABLET BY MOUTH TWICE A DAY AS NEEDED 60 tablet 0   TRELEGY ELLIPTA 100-62.5-25 MCG/ACT AEPB INHALE 1 PUFF BY MOUTH EVERY DAY 60 each 0   No current facility-administered medications for this visit.    Allergies  Allergen Reactions   Cefadroxil Hives and Palpitations    Family History  Problem Relation Age of Onset   Heart disease Father    Healthy Sister    Healthy Brother    Colon cancer Neg Hx    Prostate cancer Neg Hx     Social History   Socioeconomic History   Marital status: Significant Other    Spouse name: Harriett Sine   Number of children: Not  on file   Years of education: Not on file   Highest education level: Some college, no degree  Occupational History   Not on file  Tobacco Use   Smoking status: Every Day    Current packs/day: 0.10    Types: Cigarettes    Passive exposure: Past   Smokeless tobacco: Never   Tobacco comments:    3-4 cigarettes per day current 11-02-21  Vaping Use   Vaping status: Never Used  Substance and Sexual Activity   Alcohol use: Not Currently   Drug use: Not Currently    Types: Marijuana    Comment: daily   Sexual activity: Not on file  Other Topics Concern   Not on file  Social History Narrative   Not on file   Social Determinants of Health   Financial Resource Strain: High Risk (09/23/2022)   Overall Financial Resource Strain (CARDIA)    Difficulty of Paying  Living Expenses: Hard  Food Insecurity: No Food Insecurity (09/23/2022)   Hunger Vital Sign    Worried About Running Out of Food in the Last Year: Never true    Ran Out of Food in the Last Year: Never true  Transportation Needs: No Transportation Needs (09/23/2022)   PRAPARE - Administrator, Civil Service (Medical): No    Lack of Transportation (Non-Medical): No  Physical Activity: Insufficiently Active (09/23/2022)   Exercise Vital Sign    Days of Exercise per Week: 7 days    Minutes of Exercise per Session: 10 min  Stress: No Stress Concern Present (09/23/2022)   [employer] of Occupational Health - Occupational Stress Questionnaire    Feeling of Stress : Not at all  Social Connections: Moderately Integrated (09/23/2022)   Social Connection and Isolation Panel [NHANES]    Frequency of Communication with Friends and Family: More than three times a week    Frequency of Social Gatherings with Friends and Family: Patient declined    Attends Religious Services: Never    Database administrator or Organizations: Yes    Attends Engineer, structural: More than 4 times per year    Marital Status: Living with partner  Intimate Partner Violence: Not At Risk (05/11/2022)   Humiliation, Afraid, Rape, and Kick questionnaire    Fear of Current or Ex-Partner: No    Emotionally Abused: No    Physically Abused: No    Sexually Abused: No     Constitutional: Denies fever, malaise, fatigue, headache or abrupt weight changes.  HEENT: Denies eye pain, eye redness, ear pain, ringing in the ears, wax buildup, runny nose, nasal congestion, bloody nose, or sore throat. Respiratory: Denies difficulty breathing, shortness of breath, cough or sputum production.   Cardiovascular: Denies chest pain, chest tightness, palpitations or swelling in the hands or feet.  Gastrointestinal: Denies abdominal pain, bloating, constipation, diarrhea or blood in the stool.  GU: Denies urgency,  frequency, pain with urination, burning sensation, blood in urine, odor or discharge. Musculoskeletal: Patient reports chronic back pain.  Denies decrease in range of motion, difficulty with gait, or joint swelling.  Skin: Denies redness, rashes, lesions or ulcercations.  Neurological: Patient reports paresthesia of feet.  Denies dizziness, difficulty with memory, difficulty with speech or problems with balance and coordination.  Psych: Denies anxiety, depression, SI/HI.  No other specific complaints in a complete review of systems (except as listed in HPI above).  Objective:   Physical Exam BP 132/84 (BP Location: Left Arm, Patient Position: Sitting, Cuff Size: Normal)  Pulse 89   Temp (!) 96.8 F (36 C) (Temporal)   Wt 193 lb (87.5 kg)   SpO2 99%   BMI 28.50 kg/m   Wt Readings from Last 3 Encounters:  05/11/22 186 lb (84.4 kg)  04/11/22 189 lb (85.7 kg)  02/02/22 186 lb 8 oz (84.6 kg)    General: Appears his stated age, overall, in NAD. Skin: Warm, dry and intact.  Cardiovascular: Normal rate with irregular rhythm. No murmur, rubs noted. No JVD or BLE edema. No carotid bruits noted. Pulmonary/Chest: Normal effort and positive vesicular breath sounds. No respiratory distress. No wheezes, rales or ronchi noted.  Musculoskeletal: Bony tenderness noted over the lumbar spine.  Strength 5/5 BLE.  No difficulty with gait.  Neurological: Alert and oriented. Cranial nerves II-XII grossly intact. Coordination normal.  Psychiatric: Mood and affect normal. Behavior is normal. Judgment and thought content normal.     BMET    Component Value Date/Time   NA 141 10/30/2021 0954   K 4.8 10/30/2021 0954   CL 105 10/30/2021 0954   CO2 27 10/30/2021 0954   GLUCOSE 99 10/30/2021 0954   BUN 15 10/30/2021 0954   CREATININE 0.93 10/30/2021 0954   CALCIUM 9.9 10/30/2021 0954    Lipid Panel     Component Value Date/Time   CHOL 175 02/01/2022 0951   TRIG 117 02/01/2022 0951   HDL 60  02/01/2022 0951   CHOLHDL 2.9 02/01/2022 0951   LDLCALC 94 02/01/2022 0951    CBC    Component Value Date/Time   WBC 6.8 10/30/2021 0954   RBC 5.57 10/30/2021 0954   HGB 16.5 10/30/2021 0954   HCT 47.7 10/30/2021 0954   PLT 244 10/30/2021 0954   MCV 85.6 10/30/2021 0954   MCH 29.6 10/30/2021 0954   MCHC 34.6 10/30/2021 0954   RDW 12.3 10/30/2021 0954    Hgb A1C Lab Results  Component Value Date   HGBA1C 5.4 10/30/2021           Assessment & Plan:     RTC in 4 months for annual exam Nicki Reaper, NP

## 2022-10-04 NOTE — Assessment & Plan Note (Signed)
Encouraged diet and exercise for weight loss ?

## 2022-10-04 NOTE — Assessment & Plan Note (Signed)
Encourage smoking cessation Continue Trelegy and albuterol

## 2022-10-05 LAB — COMPLETE METABOLIC PANEL WITH GFR
AG Ratio: 1.7 (calc) (ref 1.0–2.5)
ALT: 37 U/L (ref 9–46)
AST: 35 U/L (ref 10–35)
Albumin: 4.7 g/dL (ref 3.6–5.1)
Alkaline phosphatase (APISO): 60 U/L (ref 35–144)
BUN: 14 mg/dL (ref 7–25)
CO2: 25 mmol/L (ref 20–32)
Calcium: 9.5 mg/dL (ref 8.6–10.3)
Chloride: 104 mmol/L (ref 98–110)
Creat: 0.84 mg/dL (ref 0.70–1.30)
Globulin: 2.8 g/dL (ref 1.9–3.7)
Glucose, Bld: 60 mg/dL — ABNORMAL LOW (ref 65–99)
Potassium: 4.5 mmol/L (ref 3.5–5.3)
Sodium: 139 mmol/L (ref 135–146)
Total Bilirubin: 0.6 mg/dL (ref 0.2–1.2)
Total Protein: 7.5 g/dL (ref 6.1–8.1)
eGFR: 102 mL/min/{1.73_m2} (ref >=60)

## 2022-10-05 LAB — HEMOGLOBIN A1C
Hgb A1c MFr Bld: 5.8 %{Hb} — ABNORMAL HIGH (ref <5.7)
Mean Plasma Glucose: 120 mg/dL
eAG (mmol/L): 6.6 mmol/L

## 2022-10-05 LAB — DRUG MONITORING, PANEL 8 WITH CONFIRMATION, URINE
6 Acetylmorphine: NEGATIVE ng/mL (ref <10)
Alcohol Metabolites: NEGATIVE ng/mL (ref <500)
Amphetamines: NEGATIVE ng/mL (ref <500)
Benzodiazepines: NEGATIVE ng/mL (ref <100)
Buprenorphine, Urine: NEGATIVE ng/mL (ref <5)
Cocaine Metabolite: NEGATIVE ng/mL (ref <150)
Creatinine: 9 mg/dL — ABNORMAL LOW (ref >=20.0)
MDMA: NEGATIVE ng/mL (ref <500)
Marijuana Metabolite: NEGATIVE ng/mL (ref <20)
Opiates: NEGATIVE ng/mL (ref <100)
Oxidant: NEGATIVE ug/mL (ref <200)
Oxycodone: NEGATIVE ng/mL (ref <100)
Specific Gravity: 1.001 — ABNORMAL LOW (ref >=1.003)
pH: 6.4 (ref 4.5–9.0)

## 2022-10-05 LAB — LIPID PANEL
Cholesterol: 187 mg/dL (ref <200)
HDL: 63 mg/dL (ref >=40)
LDL Cholesterol (Calc): 102 mg/dL — ABNORMAL HIGH
Non-HDL Cholesterol (Calc): 124 mg/dL (ref <130)
Total CHOL/HDL Ratio: 3 (calc) (ref <5.0)
Triglycerides: 120 mg/dL (ref <150)

## 2022-10-05 LAB — DM TEMPLATE

## 2022-10-05 LAB — CBC
HCT: 45.7 % (ref 38.5–50.0)
Hemoglobin: 15.3 g/dL (ref 13.2–17.1)
MCH: 29.4 pg (ref 27.0–33.0)
MCHC: 33.5 g/dL (ref 32.0–36.0)
MCV: 87.9 fL (ref 80.0–100.0)
MPV: 9.7 fL (ref 7.5–12.5)
Platelets: 234 10*3/uL (ref 140–400)
RBC: 5.2 10*6/uL (ref 4.20–5.80)
RDW: 12.6 % (ref 11.0–15.0)
WBC: 7.5 10*3/uL (ref 3.8–10.8)

## 2022-10-28 ENCOUNTER — Other Ambulatory Visit: Payer: Self-pay | Admitting: Internal Medicine

## 2022-10-29 NOTE — Telephone Encounter (Signed)
Requested medication (s) are due for refill today - yes  Requested medication (s) are on the active medication list -yes  Future visit scheduled -yes  Last refill: 09/28/22 60 each  Notes to clinic: off protocol- provider review    Requested Prescriptions  Pending Prescriptions Disp Refills   TRELEGY ELLIPTA 100-62.5-25 MCG/ACT AEPB [Pharmacy Med Name: TRELEGY ELLIPTA 100-62.5-25] 60 each 0    Sig: INHALE 1 PUFF BY MOUTH EVERY DAY     Off-Protocol Failed - 10/28/2022  8:27 AM      Failed - Medication not assigned to a protocol, review manually.      Passed - Valid encounter within last 12 months    Recent Outpatient Visits           3 weeks ago Mixed hyperlipidemia   [place] [facility] [place], Salvadore Oxford, NP   9 months ago Encounter for routine adult medical exam with abnormal findings   [place] [facility] [place], Salvadore Oxford, NP   12 months ago Preoperative clearance   [place] [facility] [place], Salvadore Oxford, NP   1 year ago Direct left inguinal hernia   [place] [facility] [place], [place], NP   1 year ago Spinal stenosis of lumbar region without neurogenic claudication   [place] [facility] [place], Salvadore Oxford, NP       Future Appointments             In 3 months Baity, Salvadore Oxford, NP [place] [facility], [facility]               Requested Prescriptions  Pending Prescriptions Disp Refills   TRELEGY ELLIPTA 100-62.5-25 MCG/ACT AEPB [Pharmacy Med Name: TRELEGY ELLIPTA 100-62.5-25] 60 each 0    Sig: INHALE 1 PUFF BY MOUTH EVERY DAY     Off-Protocol Failed - 10/28/2022  8:27 AM      Failed - Medication not assigned to a protocol, review manually.      Passed - Valid encounter within last 12 months    Recent Outpatient Visits           3 weeks ago Mixed hyperlipidemia   [place] [facility] [place], Salvadore Oxford, NP   9 months ago Encounter for  routine adult medical exam with abnormal findings   [place] [facility] [place], Salvadore Oxford, NP   12 months ago Preoperative clearance   [place] [facility] [place], Salvadore Oxford, NP   1 year ago Direct left inguinal hernia   [place] [facility] [place], [place], NP   1 year ago Spinal stenosis of lumbar region without neurogenic claudication   [place] [facility] [place], Salvadore Oxford, NP       Future Appointments             In 3 months Baity, Salvadore Oxford, NP [place] [facility], [facility]

## 2022-11-01 ENCOUNTER — Other Ambulatory Visit: Payer: Self-pay | Admitting: Internal Medicine

## 2022-11-02 NOTE — Telephone Encounter (Signed)
Requested medication (s) are due for refill today: yes  Requested medication (s) are on the active medication list: yes  Last refill:  10/04/22 #60  Future visit scheduled: yes  Notes to clinic:  med not delegated to NT to RF   Requested Prescriptions  Pending Prescriptions Disp Refills   traMADol (ULTRAM) 50 MG tablet [Pharmacy Med Name: TRAMADOL HCL 50 MG TABLET] 60 tablet 0    Sig: TAKE 1 TABLET BY MOUTH 2 TIMES DAILY AS NEEDED.     Not Delegated - Analgesics:  Opioid Agonists Failed - 11/01/2022  9:00 PM      Failed - This refill cannot be delegated      Failed - Urine Drug Screen completed in last 360 days      Passed - Valid encounter within last 3 months    Recent Outpatient Visits           4 weeks ago Mixed hyperlipidemia   [place] [facility] [place], Salvadore Oxford, NP   9 months ago Encounter for routine adult medical exam with abnormal findings   [place] [facility] [place], Salvadore Oxford, NP   1 year ago Preoperative clearance   [place] [facility] [place], Salvadore Oxford, NP   1 year ago Direct left inguinal hernia   [place] [facility] [place], [place], NP   1 year ago Spinal stenosis of lumbar region without neurogenic claudication   [place] [facility] [place], Salvadore Oxford, NP       Future Appointments             In 3 months Baity, Salvadore Oxford, NP  [place] [facility], [facility]

## 2022-11-24 ENCOUNTER — Other Ambulatory Visit: Payer: Self-pay | Admitting: Internal Medicine

## 2022-11-26 NOTE — Telephone Encounter (Signed)
Requested Prescriptions  Pending Prescriptions Disp Refills   ELIQUIS 5 MG TABS tablet [Pharmacy Med Name: ELIQUIS 5 MG TABLET] 180 tablet 0    Sig: TAKE 1 TABLET BY MOUTH TWICE A DAY AS DIRECTED     Hematology:  Anticoagulants - apixaban Passed - 11/24/2022 10:40 AM      Passed - PLT in normal range and within 360 days    Platelets  Date Value Ref Range Status  10/04/2022 234 140 - 400 Thousand/uL Final         Passed - HGB in normal range and within 360 days    Hemoglobin  Date Value Ref Range Status  10/04/2022 15.3 13.2 - 17.1 g/dL Final         Passed - HCT in normal range and within 360 days    HCT  Date Value Ref Range Status  10/04/2022 45.7 38.5 - 50.0 % Final         Passed - Cr in normal range and within 360 days    Creat  Date Value Ref Range Status  10/04/2022 0.84 0.70 - 1.30 mg/dL Final         Passed - AST in normal range and within 360 days    AST  Date Value Ref Range Status  10/04/2022 35 10 - 35 U/L Final         Passed - ALT in normal range and within 360 days    ALT  Date Value Ref Range Status  10/04/2022 37 9 - 46 U/L Final         Passed - Valid encounter within last 12 months    Recent Outpatient Visits           1 month ago Mixed hyperlipidemia   [place] [facility] [place], Salvadore Oxford, NP   9 months ago Encounter for routine adult medical exam with abnormal findings   [place] [facility] [place], Salvadore Oxford, NP   1 year ago Preoperative clearance   [place] [facility] [place], Salvadore Oxford, NP   1 year ago Direct left inguinal hernia   [place] [facility] [place], [place], NP   1 year ago Spinal stenosis of lumbar region without neurogenic claudication   [place] [facility] [place], Salvadore Oxford, NP       Future Appointments             In 2 months Baity, Salvadore Oxford, NP  [facility], [facility]

## 2022-11-27 ENCOUNTER — Other Ambulatory Visit: Payer: Self-pay | Admitting: Internal Medicine

## 2022-11-27 NOTE — Telephone Encounter (Signed)
Requested by interface surescripts. Future visit in 2 months.  Requested Prescriptions  Pending Prescriptions Disp Refills   metoprolol succinate (TOPROL-XL) 25 MG 24 hr tablet [Pharmacy Med Name: METOPROLOL SUCC ER 25 MG TAB] 180 tablet 0    Sig: TAKE 1 TABLET BY MOUTH TWICE A DAY     Cardiovascular:  Beta Blockers Passed - 11/27/2022  2:29 AM      Passed - Last BP in normal range    BP Readings from Last 1 Encounters:  10/04/22 132/84         Passed - Last Heart Rate in normal range    Pulse Readings from Last 1 Encounters:  10/04/22 89         Passed - Valid encounter within last 6 months    Recent Outpatient Visits           1 month ago Mixed hyperlipidemia   [place] [facility] [place], Salvadore Oxford, NP   9 months ago Encounter for routine adult medical exam with abnormal findings   [place] [facility] [place], Salvadore Oxford, NP   1 year ago Preoperative clearance   [place] [facility] [place], Salvadore Oxford, NP   1 year ago Direct left inguinal hernia   [place] [place], [place], NP   1 year ago Spinal stenosis of lumbar region without neurogenic claudication   [employer] [facility] [place], Salvadore Oxford, NP       Future Appointments             In 2 months Baity, Salvadore Oxford, NP [facility] [facility], [facility]

## 2022-12-02 ENCOUNTER — Other Ambulatory Visit: Payer: Self-pay | Admitting: Internal Medicine

## 2022-12-04 NOTE — Telephone Encounter (Signed)
Requested medications are due for refill today.  yes  Requested medications are on the active medications list.  yes  Last refill. 11/02/2022 #60 0 rf  Future visit scheduled.   yes  Notes to clinic.  Refill not delegated.    Requested Prescriptions  Pending Prescriptions Disp Refills   traMADol (ULTRAM) 50 MG tablet [Pharmacy Med Name: TRAMADOL HCL 50 MG TABLET] 60 tablet 0    Sig: TAKE 1 TABLET BY MOUTH 2 TIMES DAILY AS NEEDED.     Not Delegated - Analgesics:  Opioid Agonists Failed - 12/02/2022 11:02 AM      Failed - This refill cannot be delegated      Failed - Urine Drug Screen completed in last 360 days      Passed - Valid encounter within last 3 months    Recent Outpatient Visits           2 months ago Mixed hyperlipidemia   [place] [facility] [place], Salvadore Oxford, NP   10 months ago Encounter for routine adult medical exam with abnormal findings   [place] [facility] [place], Salvadore Oxford, NP   1 year ago Preoperative clearance   [place] [facility] [place] [place], Salvadore Oxford, NP   1 year ago Direct left inguinal hernia   [place] [facility] [place], [place], NP   1 year ago Spinal stenosis of lumbar region without neurogenic claudication   [place] [facility] [place], Salvadore Oxford, NP       Future Appointments             In 2 months Baity, Salvadore Oxford, NP [place] [facility], [facility]

## 2023-01-03 ENCOUNTER — Other Ambulatory Visit: Payer: Self-pay | Admitting: Internal Medicine

## 2023-01-03 NOTE — Telephone Encounter (Signed)
Requested medications are due for refill today.  yes  Requested medications are on the active medications list.  yes  Last refill. 12/04/2022 #60 0 rf  Future visit scheduled.   yes  Notes to clinic.  Refill not delegated.    Requested Prescriptions  Pending Prescriptions Disp Refills   traMADol (ULTRAM) 50 MG tablet [Pharmacy Med Name: TRAMADOL HCL 50 MG TABLET] 60 tablet 0    Sig: TAKE 1 TABLET BY MOUTH 2 TIMES DAILY AS NEEDED.     Not Delegated - Analgesics:  Opioid Agonists Failed - 01/03/2023  6:28 AM      Failed - This refill cannot be delegated      Failed - Urine Drug Screen completed in last 360 days      Failed - Valid encounter within last 3 months    Recent Outpatient Visits           3 months ago Mixed hyperlipidemia   [place] [facility] [place], Salvadore Oxford, NP   11 months ago Encounter for routine adult medical exam with abnormal findings   [facility] [place], Salvadore Oxford, NP   1 year ago Preoperative clearance   [place] [facility] [place], Salvadore Oxford, NP   1 year ago Direct left inguinal hernia   [place] [facility] [place], [place], NP   1 year ago Spinal stenosis of lumbar region without neurogenic claudication   [facility] [place], Salvadore Oxford, NP       Future Appointments             In 1 month Baity, Salvadore Oxford, NP  [facility], [facility]

## 2023-01-26 ENCOUNTER — Encounter: Payer: Self-pay | Admitting: Cardiology

## 2023-01-27 ENCOUNTER — Other Ambulatory Visit: Payer: Self-pay | Admitting: *Deleted

## 2023-01-27 MED ORDER — ATORVASTATIN CALCIUM 20 MG PO TABS: 20.0000 mg | ORAL_TABLET | Freq: Every day | ORAL | 1 refills | Status: DC

## 2023-01-31 ENCOUNTER — Other Ambulatory Visit: Payer: Self-pay | Admitting: Internal Medicine

## 2023-02-04 ENCOUNTER — Encounter: Payer: Self-pay | Admitting: Internal Medicine

## 2023-02-04 ENCOUNTER — Ambulatory Visit (INDEPENDENT_AMBULATORY_CARE_PROVIDER_SITE_OTHER): Payer: Medicare Other | Admitting: Internal Medicine

## 2023-02-04 VITALS — BP 132/84 | Ht 69.0 in | Wt 196.8 lb

## 2023-02-04 DIAGNOSIS — Z0001 Encounter for general adult medical examination with abnormal findings: Secondary | ICD-10-CM | POA: Diagnosis not present

## 2023-02-04 DIAGNOSIS — E782 Mixed hyperlipidemia: Secondary | ICD-10-CM

## 2023-02-04 DIAGNOSIS — R739 Hyperglycemia, unspecified: Secondary | ICD-10-CM | POA: Diagnosis not present

## 2023-02-04 DIAGNOSIS — B9789 Other viral agents as the cause of diseases classified elsewhere: Secondary | ICD-10-CM

## 2023-02-04 DIAGNOSIS — Z125 Encounter for screening for malignant neoplasm of prostate: Secondary | ICD-10-CM

## 2023-02-04 DIAGNOSIS — J329 Chronic sinusitis, unspecified: Secondary | ICD-10-CM

## 2023-02-04 DIAGNOSIS — L738 Other specified follicular disorders: Secondary | ICD-10-CM | POA: Diagnosis not present

## 2023-02-04 DIAGNOSIS — R221 Localized swelling, mass and lump, neck: Secondary | ICD-10-CM | POA: Diagnosis not present

## 2023-02-04 MED ORDER — MUPIROCIN CALCIUM 2 % EX CREA: 1.0000 | TOPICAL_CREAM | Freq: Two times a day (BID) | CUTANEOUS | 0 refills | Status: DC

## 2023-02-04 MED ORDER — PREDNISONE 10 MG PO TABS: ORAL_TABLET | ORAL | 0 refills | Status: DC

## 2023-02-04 MED ORDER — TRAMADOL HCL 50 MG PO TABS: 50.0000 mg | ORAL_TABLET | Freq: Two times a day (BID) | ORAL | 0 refills | Status: DC | PRN

## 2023-02-04 NOTE — Progress Notes (Signed)
 Subjective:    Patient ID: Jeremy Turner, male    DOB: 05/09/1965, 58 y.o.   MRN: [id number]  HPI  Patient presents to clinic today for his annual exam.  Flu: never Tetanus: < 10 years ago COVID: never Shingrix: never PSA screening: 01/2022 Colon screening: 03/2022 Vision screening: as needed Dentist: as needed  Diet: He does eat meat. He consumes fruits and veggies. He tries to avoid fried foods. He drinks mostly coffee, some water . Exercise: Walking, stretching  Review of Systems     Past Medical History:  Diagnosis Date   Aortic atherosclerosis (HCC)    Bilateral inguinal hernia    CAD (coronary artery disease) 03/2019   a.) LHC (done in [place] 30% LCx disease - med mgmt.   Cardiac murmur    COPD (chronic obstructive pulmonary disease) (HCC)    DDD (degenerative disc disease), lumbar    HLD (hyperlipidemia)    Long term current use of anticoagulant    a.) apixaban    Marijuana use    a.) UDS (+) for [facility] 05/2021   PAF (paroxysmal atrial fibrillation) (HCC)    a.) CHA2DS2VASc = 1 (vascular disease history);  b.) rate/rhythm maintained on oral metoprolol  succinate; chronically anticoagulated with apixaban    Stenosis of lateral recess of lumbar spine    a.) s/p laminectomy with L3-L5 posterior and interbody fusion   Uncomplicated opioid use    a.) long term Tramadol  Rx'd by PCP; signed OUA on file    Current Outpatient Medications  Medication Sig Dispense Refill   acetaminophen  (TYLENOL ) 500 MG tablet Take 2 tablets (1,000 mg total) by mouth every 6 (six) hours as needed for mild pain.     albuterol (PROVENTIL) (2.5 MG/3ML) 0.083% nebulizer solution SMARTSIG:1 Vial(s) Via Nebulizer 4 Times Daily PRN     albuterol (VENTOLIN HFA) 108 (90 Base) MCG/ACT inhaler SMARTSIG:2 Puff(s) Via Inhaler 3 Times Daily PRN     aspirin EC 81 MG tablet Take by mouth.     atorvastatin  (LIPITOR) 20 MG tablet Take 1 tablet (20 mg total) by mouth daily. 30 tablet 1   diclofenac Sodium  (VOLTAREN) 1 % GEL Apply 2 g topically 4 (four) times daily.     ELIQUIS  5 MG TABS tablet TAKE 1 TABLET BY MOUTH TWICE A DAY AS DIRECTED 180 tablet 0   Fluticasone-Umeclidin-Vilant (TRELEGY ELLIPTA ) 100-62.5-25 MCG/ACT AEPB INHALE 1 PUFF BY MOUTH EVERY DAY 60 each 4   metoprolol  succinate (TOPROL -XL) 25 MG 24 hr tablet TAKE 1 TABLET BY MOUTH TWICE A DAY 180 tablet 0   traMADol  (ULTRAM ) 50 MG tablet Take 1 tablet (50 mg total) by mouth 2 (two) times daily as needed. 60 tablet 0   No current facility-administered medications for this visit.    Allergies  Allergen Reactions   Cefadroxil Hives and Palpitations    Family History  Problem Relation Age of Onset   Heart disease Father    Healthy Sister    Healthy Brother    Colon cancer Neg Hx    Prostate cancer Neg Hx     Social History   Socioeconomic History   Marital status: Significant Other    Spouse name: Inocente   Number of children: Not on file   Years of education: Not on file   Highest education level: Associate degree: occupational, scientist, product/process development, or vocational program  Occupational History   Not on file  Tobacco Use   Smoking status: Every Day    Current packs/day: 0.10    Types:  Cigarettes    Passive exposure: Past   Smokeless tobacco: Never   Tobacco comments:    3-4 cigarettes per day current 11-02-21  Vaping Use   Vaping status: Never Used  Substance and Sexual Activity   Alcohol use: Not Currently   Drug use: Not Currently    Types: Marijuana    Comment: daily   Sexual activity: Not on file  Other Topics Concern   Not on file  Social History Narrative   Not on file   Social Drivers of Health   Financial Resource Strain: High Risk (02/03/2023)   Overall Financial Resource Strain (CARDIA)    Difficulty of Paying Living Expenses: Hard  Food Insecurity: No Food Insecurity (02/03/2023)   Hunger Vital Sign    Worried About Running Out of Food in the Last Year: Never true    Ran Out of Food in the Last Year:  Never true  Transportation Needs: No Transportation Needs (02/03/2023)   PRAPARE - Administrator, Civil Service (Medical): No    Lack of Transportation (Non-Medical): No  Physical Activity: Sufficiently Active (02/03/2023)   Exercise Vital Sign    Days of Exercise per Week: 7 days    Minutes of Exercise per Session: 30 min  Stress: No Stress Concern Present (02/03/2023)   [employer] of Occupational Health - Occupational Stress Questionnaire    Feeling of Stress : Not at all  Social Connections: Moderately Integrated (02/03/2023)   Social Connection and Isolation Panel [NHANES]    Frequency of Communication with Friends and Family: More than three times a week    Frequency of Social Gatherings with Friends and Family: More than three times a week    Attends Religious Services: Never    Database Administrator or Organizations: Yes    Attends Engineer, Structural: More than 4 times per year    Marital Status: Living with partner  Intimate Partner Violence: Not At Risk (05/11/2022)   Humiliation, Afraid, Rape, and Kick questionnaire    Fear of Current or Ex-Partner: No    Emotionally Abused: No    Physically Abused: No    Sexually Abused: No     Constitutional: Denies fever, malaise, fatigue, headache or abrupt weight changes.  HEENT: Patient reports left-sided facial pressure.  Denies eye pain, eye redness, ear pain, ringing in the ears, wax buildup, runny nose, nasal congestion, bloody nose, or sore throat. Respiratory: Denies difficulty breathing, shortness of breath, cough or sputum production.   Cardiovascular: Denies chest pain, chest tightness, palpitations or swelling in the hands or feet.  Gastrointestinal: Denies abdominal pain, bloating, constipation, diarrhea or blood in the stool.  GU: Denies urgency, frequency, pain with urination, burning sensation, blood in urine, odor or discharge. Musculoskeletal: Patient reports chronic low back pain, swelling to  the left side of the neck.  Denies decrease in range of motion, difficulty with gait, muscle pain or joint swelling.  Skin: Patient reports scabs to the left side of his chin.  Denies redness, rashes, or ulcercations.  Neurological: Denies dizziness, difficulty with memory, difficulty with speech or problems with balance and coordination.  Psych: Denies anxiety, depression, SI/HI.  No other specific complaints in a complete review of systems (except as listed in HPI above).  Objective:   Physical Exam  BP 132/84 (BP Location: Left Arm, Patient Position: Sitting, Cuff Size: Large)   Ht 5' 9 (1.753 m)   Wt 196 lb 12.8 oz (89.3 kg)   BMI 29.06  kg/m    Wt Readings from Last 3 Encounters:  10/04/22 193 lb (87.5 kg)  05/11/22 186 lb (84.4 kg)  04/11/22 189 lb (85.7 kg)    General: Appears his stated age, overweight, in NAD. Skin: Warm, dry and intact.  Folliculitis Barbae noted of bilateral chin, worse on the left. HEENT: Head: normal shape and size, left maxillary sinus tenderness noted; Eyes: sclera white, no icterus, conjunctiva pink, PERRLA and EOMs intact;  Neck:  Neck supple, trachea midline. He has diffuse swelling but no discrete mass noted of the left side of the neck. Cardiovascular: Bradycardic with normal rhythm. S1,S2 noted.  No murmur, rubs or gallops noted. No JVD or BLE edema. No carotid bruits noted. Pulmonary/Chest: Normal effort and positive vesicular breath sounds. No respiratory distress. No wheezes, rales or ronchi noted.  Abdomen: Normal bowel sounds.  Musculoskeletal: Strength 5/5 BUE/BLE.  No difficulty with gait.  Neurological: Alert and oriented. Cranial nerves II-XII grossly intact. Coordination normal.  Psychiatric: Mood and affect normal. Behavior is normal. Judgment and thought content normal.   BMET    Component Value Date/Time   NA 139 10/04/2022 0917   K 4.5 10/04/2022 0917   CL 104 10/04/2022 0917   CO2 25 10/04/2022 0917   GLUCOSE 60 (L)  10/04/2022 0917   BUN 14 10/04/2022 0917   CREATININE 0.84 10/04/2022 0917   CALCIUM  9.5 10/04/2022 0917    Lipid Panel     Component Value Date/Time   CHOL 187 10/04/2022 0917   TRIG 120 10/04/2022 0917   HDL 63 10/04/2022 0917   CHOLHDL 3.0 10/04/2022 0917   LDLCALC 102 (H) 10/04/2022 0917    CBC    Component Value Date/Time   WBC 7.5 10/04/2022 0917   RBC 5.20 10/04/2022 0917   HGB 15.3 10/04/2022 0917   HCT 45.7 10/04/2022 0917   PLT 234 10/04/2022 0917   MCV 87.9 10/04/2022 0917   MCH 29.4 10/04/2022 0917   MCHC 33.5 10/04/2022 0917   RDW 12.6 10/04/2022 0917    Hgb A1C Lab Results  Component Value Date   HGBA1C 5.8 (H) 10/04/2022            Assessment & Plan:   Preventative Health Maintenance:  He declines flu shot He declines tetanus booster Encouraged him to get his COVID-vaccine Discussed Shingrix vaccine, he will check coverage with his insurance company and schedule a visit at the pharmacy if he would like to have this done Colon screening UTD Will reach out via MyChart to see if he is interested in lung cancer screening. Encouraged him to consume a balanced diet and exercise regimen Advised him to see an eye doctor and dentist annually Will check CBC, c-Met, lipid, A1c and PSA today  Folliculitis barbae:  Rx for mupirocin  2% cream twice daily as needed Be sure to use a new razor or wash your razor with alcohol after each use  Viral sinusitis:  Rx for Pred taper x 6 days Can use a neti pot which can be purchased from your local pharmacy  Mass of left side of neck:  CT soft tissue neck with contrast ordered for further evaluation  RTC in 6 months, follow-up chronic conditions Angeline Laura, NP

## 2023-02-04 NOTE — Patient Instructions (Signed)
 Health Maintenance, Male  Adopting a healthy lifestyle and getting preventive care are important in promoting health and wellness. Ask your health care provider about:  The right schedule for you to have regular tests and exams.  Things you can do on your own to prevent diseases and keep yourself healthy.  What should I know about diet, weight, and exercise?  Eat a healthy diet    Eat a diet that includes plenty of vegetables, fruits, low-fat dairy products, and lean protein.  Do not eat a lot of foods that are high in solid fats, added sugars, or sodium.  Maintain a healthy weight  Body mass index (BMI) is a measurement that can be used to identify possible weight problems. It estimates body fat based on height and weight. Your health care provider can help determine your BMI and help you achieve or maintain a healthy weight.  Get regular exercise  Get regular exercise. This is one of the most important things you can do for your health. Most adults should:  Exercise for at least 150 minutes each week. The exercise should increase your heart rate and make you sweat (moderate-intensity exercise).  Do strengthening exercises at least twice a week. This is in addition to the moderate-intensity exercise.  Spend less time sitting. Even light physical activity can be beneficial.  Watch cholesterol and blood lipids  Have your blood tested for lipids and cholesterol at 58 years of age, then have this test every 5 years.  You may need to have your cholesterol levels checked more often if:  Your lipid or cholesterol levels are high.  You are older than 58 years of age.  You are at high risk for heart disease.  What should I know about cancer screening?  Many types of cancers can be detected early and may often be prevented. Depending on your health history and family history, you may need to have cancer screening at various ages. This may include screening for:  Colorectal cancer.  Prostate cancer.  Skin cancer.  Lung  cancer.  What should I know about heart disease, diabetes, and high blood pressure?  Blood pressure and heart disease  High blood pressure causes heart disease and increases the risk of stroke. This is more likely to develop in people who have high blood pressure readings or are overweight.  Talk with your health care provider about your target blood pressure readings.  Have your blood pressure checked:  Every 3-5 years if you are 24-52 years of age.  Every year if you are 3 years old or older.  If you are between the ages of 60 and 72 and are a current or former smoker, ask your health care provider if you should have a one-time screening for abdominal aortic aneurysm (AAA).  Diabetes  Have regular diabetes screenings. This checks your fasting blood sugar level. Have the screening done:  Once every three years after age 66 if you are at a normal weight and have a low risk for diabetes.  More often and at a younger age if you are overweight or have a high risk for diabetes.  What should I know about preventing infection?  Hepatitis B  If you have a higher risk for hepatitis B, you should be screened for this virus. Talk with your health care provider to find out if you are at risk for hepatitis B infection.  Hepatitis C  Blood testing is recommended for:  Everyone born from [birth year] through 1965.  Anyone  with known risk factors for hepatitis C.  Sexually transmitted infections (STIs)  You should be screened each year for STIs, including gonorrhea and chlamydia, if:  You are sexually active and are younger than 58 years of age.  You are older than 58 years of age and your health care provider tells you that you are at risk for this type of infection.  Your sexual activity has changed since you were last screened, and you are at increased risk for chlamydia or gonorrhea. Ask your health care provider if you are at risk.  Ask your health care provider about whether you are at high risk for HIV. Your health care provider  may recommend a prescription medicine to help prevent HIV infection. If you choose to take medicine to prevent HIV, you should first get tested for HIV. You should then be tested every 3 months for as long as you are taking the medicine.  Follow these instructions at home:  Alcohol use  Do not drink alcohol if your health care provider tells you not to drink.  If you drink alcohol:  Limit how much you have to 0-2 drinks a day.  Know how much alcohol is in your drink. In the [place] one drink equals one 12 oz bottle of beer (355 mL), one 5 oz glass of wine (148 mL), or one 1 oz glass of hard liquor (44 mL).  Lifestyle  Do not use any products that contain nicotine or tobacco. These products include cigarettes, chewing tobacco, and vaping devices, such as e-cigarettes. If you need help quitting, ask your health care provider.  Do not use street drugs.  Do not share needles.  Ask your health care provider for help if you need support or information about quitting drugs.  General instructions  Schedule regular health, dental, and eye exams.  Stay current with your vaccines.  Tell your health care provider if:  You often feel depressed.  You have ever been abused or do not feel safe at home.  Summary  Adopting a healthy lifestyle and getting preventive care are important in promoting health and wellness.  Follow your health care provider's instructions about healthy diet, exercising, and getting tested or screened for diseases.  Follow your health care provider's instructions on monitoring your cholesterol and blood pressure.  This information is not intended to replace advice given to you by your health care provider. Make sure you discuss any questions you have with your health care provider.  Document Revised: 06/06/2020 Document Reviewed: 06/06/2020  Elsevier Patient Education  2024 [employer].

## 2023-02-05 ENCOUNTER — Encounter: Payer: Self-pay | Admitting: Internal Medicine

## 2023-02-05 LAB — COMPLETE METABOLIC PANEL WITH GFR
AG Ratio: 2 (calc) (ref 1.0–2.5)
ALT: 30 U/L (ref 9–46)
AST: 23 U/L (ref 10–35)
Albumin: 4.9 g/dL (ref 3.6–5.1)
Alkaline phosphatase (APISO): 66 U/L (ref 35–144)
BUN: 14 mg/dL (ref 7–25)
CO2: 25 mmol/L (ref 20–32)
Calcium: 9.8 mg/dL (ref 8.6–10.3)
Chloride: 104 mmol/L (ref 98–110)
Creat: 0.94 mg/dL (ref 0.70–1.30)
Globulin: 2.5 g/dL (ref 1.9–3.7)
Glucose, Bld: 96 mg/dL (ref 65–99)
Potassium: 4.4 mmol/L (ref 3.5–5.3)
Sodium: 139 mmol/L (ref 135–146)
Total Bilirubin: 0.5 mg/dL (ref 0.2–1.2)
Total Protein: 7.4 g/dL (ref 6.1–8.1)
eGFR: 95 mL/min/{1.73_m2} (ref >=60)

## 2023-02-05 LAB — HEMOGLOBIN A1C
Hgb A1c MFr Bld: 5.9 %{Hb} — ABNORMAL HIGH (ref <5.7)
Mean Plasma Glucose: 123 mg/dL
eAG (mmol/L): 6.8 mmol/L

## 2023-02-05 LAB — PSA: PSA: 0.56 ng/mL (ref <=4.00)

## 2023-02-05 LAB — LIPID PANEL
Cholesterol: 202 mg/dL — ABNORMAL HIGH (ref <200)
HDL: 55 mg/dL (ref >=40)
LDL Cholesterol (Calc): 119 mg/dL — ABNORMAL HIGH
Non-HDL Cholesterol (Calc): 147 mg/dL — ABNORMAL HIGH (ref <130)
Total CHOL/HDL Ratio: 3.7 (calc) (ref <5.0)
Triglycerides: 168 mg/dL — ABNORMAL HIGH (ref <150)

## 2023-02-05 LAB — CBC
HCT: 48.4 % (ref 38.5–50.0)
Hemoglobin: 16.5 g/dL (ref 13.2–17.1)
MCH: 29.2 pg (ref 27.0–33.0)
MCHC: 34.1 g/dL (ref 32.0–36.0)
MCV: 85.5 fL (ref 80.0–100.0)
MPV: 9.9 fL (ref 7.5–12.5)
Platelets: 257 10*3/uL (ref 140–400)
RBC: 5.66 10*6/uL (ref 4.20–5.80)
RDW: 12.3 % (ref 11.0–15.0)
WBC: 7.9 10*3/uL (ref 3.8–10.8)

## 2023-02-07 ENCOUNTER — Ambulatory Visit
Admission: RE | Admit: 2023-02-07 | Discharge: 2023-02-07 | Disposition: A | Discharge status: Home / Self Care | Payer: Medicare Other | Source: Ambulatory Visit | Attending: Internal Medicine | Admitting: Internal Medicine

## 2023-02-07 DIAGNOSIS — M47812 Spondylosis without myelopathy or radiculopathy, cervical region: Secondary | ICD-10-CM | POA: Diagnosis not present

## 2023-02-07 DIAGNOSIS — M542 Cervicalgia: Secondary | ICD-10-CM | POA: Diagnosis not present

## 2023-02-07 DIAGNOSIS — I700 Atherosclerosis of aorta: Secondary | ICD-10-CM | POA: Diagnosis not present

## 2023-02-07 DIAGNOSIS — R221 Localized swelling, mass and lump, neck: Secondary | ICD-10-CM | POA: Diagnosis not present

## 2023-02-07 MED ORDER — IOHEXOL 300 MG/ML  SOLN
75.0000 mL | Freq: Once | INTRAMUSCULAR | Status: AC | PRN
  Administered 2023-02-07: 75 mL via INTRAVENOUS

## 2023-02-07 MED ORDER — SODIUM CHLORIDE 0.9 % IV SOLN: INTRAVENOUS | Status: DC

## 2023-02-18 ENCOUNTER — Encounter: Payer: Self-pay | Admitting: Internal Medicine

## 2023-02-28 ENCOUNTER — Other Ambulatory Visit: Payer: Self-pay | Admitting: Internal Medicine

## 2023-03-01 ENCOUNTER — Other Ambulatory Visit: Payer: Self-pay | Admitting: Internal Medicine

## 2023-03-01 NOTE — Telephone Encounter (Signed)
Requested Prescriptions  Pending Prescriptions Disp Refills   ELIQUIS 5 MG TABS tablet [Pharmacy Med Name: ELIQUIS 5 MG TABLET] 180 tablet 0    Sig: TAKE 1 TABLET BY MOUTH TWICE A DAY AS DIRECTED     Hematology:  Anticoagulants - apixaban Passed - 03/01/2023  1:40 PM      Passed - PLT in normal range and within 360 days    Platelets  Date Value Ref Range Status  02/04/2023 257 140 - 400 Thousand/uL Final         Passed - HGB in normal range and within 360 days    Hemoglobin  Date Value Ref Range Status  02/04/2023 16.5 13.2 - 17.1 g/dL Final         Passed - HCT in normal range and within 360 days    HCT  Date Value Ref Range Status  02/04/2023 48.4 38.5 - 50.0 % Final         Passed - Cr in normal range and within 360 days    Creat  Date Value Ref Range Status  02/04/2023 0.94 0.70 - 1.30 mg/dL Final         Passed - AST in normal range and within 360 days    AST  Date Value Ref Range Status  02/04/2023 23 10 - 35 U/L Final         Passed - ALT in normal range and within 360 days    ALT  Date Value Ref Range Status  02/04/2023 30 9 - 46 U/L Final         Passed - Valid encounter within last 12 months    Recent Outpatient Visits           3 weeks ago Encounter for general adult medical examination with abnormal findings   [place] [facility] [place], Salvadore Oxford, NP   4 months ago Mixed hyperlipidemia   [place] [facility] [place], Salvadore Oxford, NP   1 year ago Encounter for routine adult medical exam with abnormal findings   [place] [facility] [place], Salvadore Oxford, NP   1 year ago Preoperative clearance   [place] [facility] [place], Salvadore Oxford, NP   1 year ago Direct left inguinal hernia   [place] [facility] [place], Salvadore Oxford, NP       Future Appointments             In 2 weeks Agbor-Etang, Arlys John, MD [facility] [place]   In 5 months Baity, Salvadore Oxford, NP [facility], [facility]

## 2023-03-01 NOTE — Telephone Encounter (Signed)
Requested Prescriptions  Pending Prescriptions Disp Refills   metoprolol succinate (TOPROL-XL) 25 MG 24 hr tablet [Pharmacy Med Name: METOPROLOL SUCC ER 25 MG TAB] 180 tablet 0    Sig: TAKE 1 TABLET BY MOUTH TWICE A DAY     Cardiovascular:  Beta Blockers Passed - 03/01/2023  2:29 PM      Passed - Last BP in normal range    BP Readings from Last 1 Encounters:  02/04/23 132/84         Passed - Last Heart Rate in normal range    Pulse Readings from Last 1 Encounters:  10/04/22 89         Passed - Valid encounter within last 6 months    Recent Outpatient Visits           3 weeks ago Encounter for general adult medical examination with abnormal findings   [place] [facility] [place], Salvadore Oxford, NP   4 months ago Mixed hyperlipidemia   [place] [facility] [place], Salvadore Oxford, NP   1 year ago Encounter for routine adult medical exam with abnormal findings   [place] [facility] [place], Salvadore Oxford, NP   1 year ago Preoperative clearance   [place] [facility] [place], Salvadore Oxford, NP   1 year ago Direct left inguinal hernia   [place] [facility] [place], Salvadore Oxford, NP       Future Appointments             In 2 weeks Agbor-Etang, Arlys John, MD [facility] [place]   In 5 months Baity, Salvadore Oxford, NP [facility], [facility]

## 2023-03-06 ENCOUNTER — Other Ambulatory Visit: Payer: Self-pay | Admitting: Internal Medicine

## 2023-03-07 ENCOUNTER — Other Ambulatory Visit: Payer: Self-pay | Admitting: Internal Medicine

## 2023-03-07 MED ORDER — TRAMADOL HCL 50 MG PO TABS: 50.0000 mg | ORAL_TABLET | Freq: Two times a day (BID) | ORAL | 0 refills | Status: DC | PRN

## 2023-03-07 NOTE — Telephone Encounter (Signed)
 Requested medications are due for refill today.  no  Requested medications are on the active medications list.  yes  Last refill. 03/07/2023 #60 0 rf  Future visit scheduled.   yes  Notes to clinic.  Refill/refusal not delegated.    Requested Prescriptions  Pending Prescriptions Disp Refills   traMADol  (ULTRAM ) 50 MG tablet [Pharmacy Med Name: TRAMADOL  HCL 50 MG TABLET] 60 tablet 0    Sig: TAKE 1 TABLET BY MOUTH 2 TIMES DAILY AS NEEDED.     Not Delegated - Analgesics:  Opioid Agonists Failed - 03/07/2023  1:10 PM      Failed - This refill cannot be delegated      Failed - Urine Drug Screen completed in last 360 days      Passed - Valid encounter within last 3 months    Recent Outpatient Visits           1 month ago Encounter for general adult medical examination with abnormal findings   [place] [facility] [place], Angeline ORN, NP   5 months ago Mixed hyperlipidemia   [place] [facility] [place], Angeline ORN, NP   1 year ago Encounter for routine adult medical exam with abnormal findings   [place] [facility] [place], Angeline ORN, NP   1 year ago Preoperative clearance   [place] [facility] [place], Angeline ORN, NP   1 year ago Direct left inguinal hernia   [facility] [place], Angeline ORN, NP       Future Appointments             In 1 week Darliss Rogue, MD [facility] [place]   In 5 months Baity, Angeline ORN, NP  [facility], [facility]

## 2023-03-19 ENCOUNTER — Ambulatory Visit: Payer: Medicare Other | Attending: Cardiology | Admitting: Cardiology

## 2023-03-19 ENCOUNTER — Encounter: Payer: Self-pay | Admitting: Cardiology

## 2023-03-19 VITALS — BP 118/80 | HR 80 | Ht 69.0 in | Wt 195.4 lb

## 2023-03-19 DIAGNOSIS — R079 Chest pain, unspecified: Secondary | ICD-10-CM | POA: Diagnosis not present

## 2023-03-19 DIAGNOSIS — IMO0001 Reserved for inherently not codable concepts without codable children: Secondary | ICD-10-CM

## 2023-03-19 DIAGNOSIS — F17200 Nicotine dependence, unspecified, uncomplicated: Secondary | ICD-10-CM

## 2023-03-19 DIAGNOSIS — I480 Paroxysmal atrial fibrillation: Secondary | ICD-10-CM

## 2023-03-19 DIAGNOSIS — I2510 Atherosclerotic heart disease of native coronary artery without angina pectoris: Secondary | ICD-10-CM | POA: Diagnosis not present

## 2023-03-19 DIAGNOSIS — E782 Mixed hyperlipidemia: Secondary | ICD-10-CM | POA: Diagnosis not present

## 2023-03-19 MED ORDER — PANTOPRAZOLE SODIUM 40 MG PO TBEC: 40.0000 mg | DELAYED_RELEASE_TABLET | Freq: Every day | ORAL | 0 refills | Status: DC

## 2023-03-19 MED ORDER — ATORVASTATIN CALCIUM 40 MG PO TABS: 40.0000 mg | ORAL_TABLET | Freq: Every day | ORAL | 0 refills | Status: DC

## 2023-03-19 NOTE — Progress Notes (Signed)
 Cardiology Office Note:    Date:  03/19/2023   ID:  Jeremy Turner, DOB 1965-03-08, MRN [id number]  PCP:  Lorre Munroe, NP   [facility] Providers Cardiologist:  Debbe Odea, MD     Referring MD: Lorre Munroe, NP   Chief Complaint  Patient presents with   Follow-up    Patient denies new or acute cardiac problems/concerns today.      History of Present Illness:    Jeremy Turner is a 58 y.o. 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    States having chest pain typically when waking up.  Passing gas and burping makes discomfort better.  Denies shortness of breath.  He still smokes he is working on quitting.  Has occasional skipped heartbeats.  Compliant with Eliquis as prescribed, denies any bleeding issues.  Prior notes Echo 12/2021 EF 50 to 55% -echocardiogram 08/2020 EF 55% apparently had a stress test back in 2021 which was abnormal,  left heart cath 03/2019 showing 30% left circumflex disease.   Past Medical History:  Diagnosis Date   Aortic atherosclerosis (HCC)    Bilateral inguinal hernia    CAD (coronary artery disease) 03/2019   a.) LHC (done in [place] 30% LCx disease - med mgmt.   Cardiac murmur    COPD (chronic obstructive pulmonary disease) (HCC)    DDD (degenerative disc disease), lumbar    HLD (hyperlipidemia)    Long term current use of anticoagulant    a.) apixaban   Marijuana use    a.) UDS (+) for [facility] 05/2021   PAF (paroxysmal atrial fibrillation) (HCC)    a.) CHA2DS2VASc = 1 (vascular disease history);  b.) rate/rhythm maintained on oral metoprolol succinate; chronically anticoagulated with apixaban   Stenosis of lateral recess of lumbar spine    a.) s/p laminectomy with L3-L5 posterior and interbody fusion   Uncomplicated opioid use    a.) long term Tramadol Rx'd by PCP; signed OUA on file    Past  Surgical History:  Procedure Laterality Date   CARDIAC CATHETERIZATION  03/2019   COLONOSCOPY WITH PROPOFOL N/A 04/11/2022   Procedure: COLONOSCOPY WITH PROPOFOL;  Surgeon: Toney Reil, MD;  Location[facility]COPY;  Service: Gastroenterology;  Laterality: N/A;   Herina repair  10/2021   PATELLA FRACTURE SURGERY Left    1990's   POSTERIOR LUMBAR FUSION  09/2020   Procedure: LAMINECTOMY;  L3-L5 POSTERIOR AND INTERBODY FUSION    Current Medications: Current Meds  Medication Sig   acetaminophen (TYLENOL) 500 MG tablet Take 2 tablets (1,000 mg total) by mouth every 6 (six) hours as needed for mild pain.   albuterol (PROVENTIL) (2.5 MG/3ML) 0.083% nebulizer solution SMARTSIG:1 Vial(s) Via Nebulizer 4 Times Daily PRN   albuterol (VENTOLIN HFA) 108 (90 Base) MCG/ACT inhaler SMARTSIG:2 Puff(s) Via Inhaler 3 Times Daily PRN   diclofenac Sodium (VOLTAREN) 1 % GEL Apply 2 g topically 4 (four) times daily.   ELIQUIS 5 MG TABS tablet TAKE 1 TABLET BY MOUTH TWICE A DAY AS DIRECTED   Fluticasone-Umeclidin-Vilant (TRELEGY ELLIPTA) 100-62.5-25 MCG/ACT AEPB INHALE 1 PUFF BY MOUTH EVERY DAY   metoprolol succinate (TOPROL-XL) 25 MG 24 hr tablet TAKE 1 TABLET BY MOUTH TWICE A DAY   mupirocin cream (BACTROBAN) 2 % Apply 1 Application topically 2 (two) times daily.   pantoprazole (PROTONIX) 40 MG tablet Take 1 tablet (40 mg total) by mouth daily.  traMADol (ULTRAM) 50 MG tablet Take 1 tablet (50 mg total) by mouth 2 (two) times daily as needed.   [DISCONTINUED] aspirin EC 81 MG tablet Take by mouth.   [DISCONTINUED] atorvastatin (LIPITOR) 20 MG tablet Take 1 tablet (20 mg total) by mouth daily.     Allergies:   Cefadroxil   Social History   Socioeconomic History   Marital status: Significant Other    Spouse name: Harriett Sine   Number of children: Not on file   Years of education: Not on file   Highest education level: Associate degree: occupational, Scientist, product/process development, or vocational program  Occupational  History   Not on file  Tobacco Use   Smoking status: Every Day    Current packs/day: 0.10    Types: Cigarettes    Passive exposure: Past   Smokeless tobacco: Never   Tobacco comments:    3-4 cigarettes per day current 11-02-21  Vaping Use   Vaping status: Never Used  Substance and Sexual Activity   Alcohol use: Not Currently   Drug use: Not Currently    Types: Marijuana    Comment: daily   Sexual activity: Not on file  Other Topics Concern   Not on file  Social History Narrative   Not on file   Social Drivers of Health   Financial Resource Strain: High Risk (02/03/2023)   Overall Financial Resource Strain (CARDIA)    Difficulty of Paying Living Expenses: Hard  Food Insecurity: No Food Insecurity (02/03/2023)   Hunger Vital Sign    Worried About Running Out of Food in the Last Year: Never true    Ran Out of Food in the Last Year: Never true  Transportation Needs: No Transportation Needs (02/03/2023)   PRAPARE - Administrator, Civil Service (Medical): No    Lack of Transportation (Non-Medical): No  Physical Activity: Sufficiently Active (02/03/2023)   Exercise Vital Sign    Days of Exercise per Week: 7 days    Minutes of Exercise per Session: 30 min  Stress: No Stress Concern Present (02/03/2023)   [employer] of Occupational Health - Occupational Stress Questionnaire    Feeling of Stress : Not at all  Social Connections: Moderately Integrated (02/03/2023)   Social Connection and Isolation Panel [NHANES]    Frequency of Communication with Friends and Family: More than three times a week    Frequency of Social Gatherings with Friends and Family: More than three times a week    Attends Religious Services: Never    Database administrator or Organizations: Yes    Attends Engineer, structural: More than 4 times per year    Marital Status: Living with partner     Family History: The patient's family history includes Healthy in his brother and sister; Heart  disease in his father. There is no history of Colon cancer or Prostate cancer.  ROS:   Please see the history of present illness.     All other systems reviewed and are negative.  EKGs/Labs/Other Studies Reviewed:    The following studies were reviewed today:   EKG Interpretation Date/Time:  Tuesday March 19 2023 08:13:10 EST Ventricular Rate:  80 PR Interval:  152 QRS Duration:  96 QT Interval:  386 QTC Calculation: 445 R Axis:   46  Text Interpretation: Sinus rhythm with frequent Premature ventricular complexes Confirmed by Debbe Odea ([id number]) on 03/19/2023 8:20:49 AM    Recent Labs: 02/04/2023: ALT 30; BUN 14; Creat 0.94; Hemoglobin 16.5; Platelets 257; Potassium  4.4; Sodium 139  Recent Lipid Panel    Component Value Date/Time   CHOL 202 (H) 02/04/2023 0822   TRIG 168 (H) 02/04/2023 0822   HDL 55 02/04/2023 0822   CHOLHDL 3.7 02/04/2023 0822   LDLCALC 119 (H) 02/04/2023 0822     Risk Assessment/Calculations:             Physical Exam:    VS:  BP 118/80 (BP Location: Left Arm, Patient Position: Sitting, Cuff Size: Normal)   Pulse 80   Ht 5\' 9"  (1.753 m)   Wt 195 lb 6.4 oz (88.6 kg)   SpO2 98%   BMI 28.86 kg/m     Wt Readings from Last 3 Encounters:  03/19/23 195 lb 6.4 oz (88.6 kg)  02/04/23 196 lb 12.8 oz (89.3 kg)  10/04/22 193 lb (87.5 kg)     GEN:  Well nourished, well developed in no acute distress HEENT: Normal NECK: No JVD; No carotid bruits CARDIAC: RRR, no murmurs, rubs, gallops RESPIRATORY:  Clear to auscultation without rales, wheezing or rhonchi  ABDOMEN: Soft, non-tender, non-distended MUSCULOSKELETAL:  No edema; No deformity  SKIN: Warm and dry NEUROLOGIC:  Alert and oriented x 3 PSYCHIATRIC:  Normal affect   ASSESSMENT:    1. Chest pain, unspecified type   2. Coronary artery disease involving native coronary artery of native heart without angina pectoris   3. Paroxysmal atrial fibrillation (HCC)   4. Mixed hyperlipidemia    5. Smoking     PLAN:    In order of problems listed above:  Chest pain, relieved with burping suggesting GI etiology.  History of hiatal hernia repair.  Start Protonix 40 mg daily.  Stop aspirin. Nonobstructive CAD, 30% left circumflex disease.  EF 50 to 55%.  Cholesterol not controlled.  Increase Lipitor to 40 mg daily.  Continue Eliquis.  Paroxysmal atrial fibrillation, currently in sinus.  Continue Toprol-XL 25 mg daily, Eliquis 5 mg twice daily. Hyperlipidemia, cholesterol not controlled.  Increase Lipitor to 40 mg daily. Current smoker, smoking cessation advised.  Follow-up in 6 to 8 weeks.    Medication Adjustments/Labs and Tests Ordered: Current medicines are reviewed at length with the patient today.  Concerns regarding medicines are outlined above.  Orders Placed This Encounter  Procedures   EKG 12-Lead   Meds ordered this encounter  Medications   atorvastatin (LIPITOR) 40 MG tablet    Sig: Take 1 tablet (40 mg total) by mouth daily.    Dispense:  90 tablet    Refill:  0   pantoprazole (PROTONIX) 40 MG tablet    Sig: Take 1 tablet (40 mg total) by mouth daily.    Dispense:  90 tablet    Refill:  0    Patient Instructions  Medication Instructions:   STOP ASPIRIN INCREASE Atorvastatin - Take one tablet ( 40mg ) by mouth daily.  START Protonix - Take one tablet ( 40mg ) by mouth daily.   *If you need a refill on your cardiac medications before your next appointment, please call your pharmacy*   Lab Work:  None Ordered  If you have labs (blood work) drawn today and your tests are completely normal, you will receive your results only by: MyChart Message (if you have MyChart) OR A paper copy in the mail If you have any lab test that is abnormal or we need to change your treatment, we will call you to review the results.   Testing/Procedures:  None Ordered   Follow-Up: At [facility],  you and your health needs are our priority.  As part of our  continuing mission to provide you with exceptional heart care, we have created designated Provider Care Teams.  These Care Teams include your primary Cardiologist (physician) and Advanced Practice Providers (APPs -  Physician Assistants and Nurse Practitioners) who all work together to provide you with the care you need, when you need it.  We recommend signing up for the patient portal called "MyChart".  Sign up information is provided on this After Visit Summary.  MyChart is used to connect with patients for Virtual Visits (Telemedicine).  Patients are able to view lab/test results, encounter notes, upcoming appointments, etc.  Non-urgent messages can be sent to your provider as well.   To learn more about what you can do with MyChart, go to [email or web address].    Your next appointment:   6 week(s)  Provider:   You may see Debbe Odea, MD or one of the following Advanced Practice Providers on your designated Care Team:   Nicolasa Ducking, NP Eula Listen, PA-C Cadence Fransico Michael, PA-C Charlsie Quest, NP Carlos Levering, NP   Signed, Debbe Odea, MD  03/19/2023 8:59 AM    [facility]

## 2023-03-19 NOTE — Patient Instructions (Signed)
 Medication Instructions:   STOP ASPIRIN INCREASE Atorvastatin - Take one tablet ( 40mg ) by mouth daily.  START Protonix - Take one tablet ( 40mg ) by mouth daily.   *If you need a refill on your cardiac medications before your next appointment, please call your pharmacy*   Lab Work:  None Ordered  If you have labs (blood work) drawn today and your tests are completely normal, you will receive your results only by: MyChart Message (if you have MyChart) OR A paper copy in the mail If you have any lab test that is abnormal or we need to change your treatment, we will call you to review the results.   Testing/Procedures:  None Ordered   Follow-Up: At [facility], you and your health needs are our priority.  As part of our continuing mission to provide you with exceptional heart care, we have created designated Provider Care Teams.  These Care Teams include your primary Cardiologist (physician) and Advanced Practice Providers (APPs -  Physician Assistants and Nurse Practitioners) who all work together to provide you with the care you need, when you need it.  We recommend signing up for the patient portal called "MyChart".  Sign up information is provided on this After Visit Summary.  MyChart is used to connect with patients for Virtual Visits (Telemedicine).  Patients are able to view lab/test results, encounter notes, upcoming appointments, etc.  Non-urgent messages can be sent to your provider as well.   To learn more about what you can do with MyChart, go to [email or web address].    Your next appointment:   6 week(s)  Provider:   You may see Debbe Odea, MD or one of the following Advanced Practice Providers on your designated Care Team:   Nicolasa Ducking, NP Eula Listen, PA-C Cadence Fransico Michael, PA-C Charlsie Quest, NP Carlos Levering, NP

## 2023-03-26 ENCOUNTER — Other Ambulatory Visit: Payer: Self-pay | Admitting: Internal Medicine

## 2023-03-26 NOTE — Telephone Encounter (Signed)
 Requested medication (s) are due for refill today: Yes  Requested medication (s) are on the active medication list: Yes  Last refill:  10/29/22  Future visit scheduled: Yes  Notes to clinic:  Unable to refill due to no refill protocol for this medication.      Requested Prescriptions  Pending Prescriptions Disp Refills   TRELEGY ELLIPTA 100-62.5-25 MCG/ACT AEPB [Pharmacy Med Name: TRELEGY ELLIPTA 100-62.5-25] 60 each 4    Sig: INHALE 1 PUFF BY MOUTH EVERY DAY     Off-Protocol Failed - 03/26/2023 11:14 AM      Failed - Medication not assigned to a protocol, review manually.      Passed - Valid encounter within last 12 months    Recent Outpatient Visits           1 month ago Encounter for general adult medical examination with abnormal findings   [place] [facility] [place], Salvadore Oxford, NP   5 months ago Mixed hyperlipidemia   [place] [facility] [place], Salvadore Oxford, NP   1 year ago Encounter for routine adult medical exam with abnormal findings   [place] [facility] [place], Salvadore Oxford, NP   1 year ago Preoperative clearance   [place] [facility] [place], Salvadore Oxford, NP   1 year ago Direct left inguinal hernia   [place] [facility] [place], Salvadore Oxford, NP       Future Appointments             In 1 month Agbor-Etang, Arlys John, MD [facility] at [place]   In 4 months Baity, Salvadore Oxford, NP [place] [facility], [facility]

## 2023-04-04 ENCOUNTER — Other Ambulatory Visit: Payer: Self-pay | Admitting: Internal Medicine

## 2023-04-04 MED ORDER — TRAMADOL HCL 50 MG PO TABS: 50.0000 mg | ORAL_TABLET | Freq: Two times a day (BID) | ORAL | 0 refills | Status: DC | PRN

## 2023-04-04 NOTE — Telephone Encounter (Signed)
 Requested medication (s) are due for refill today - no  Requested medication (s) are on the active medication list -yes  Future visit scheduled -yes  Last refill: 07/05/23 #60  Notes to clinic: non delegated Rx- duplicate request- filled today  Requested Prescriptions  Pending Prescriptions Disp Refills   traMADol (ULTRAM) 50 MG tablet [Pharmacy Med Name: TRAMADOL HCL 50 MG TABLET] 60 tablet 0    Sig: TAKE 1 TABLET BY MOUTH 2 TIMES DAILY AS NEEDED.     Not Delegated - Analgesics:  Opioid Agonists Failed - 04/04/2023  8:51 AM      Failed - This refill cannot be delegated      Failed - Urine Drug Screen completed in last 360 days      Passed - Valid encounter within last 3 months    Recent Outpatient Visits           1 month ago Encounter for general adult medical examination with abnormal findings   [place] [facility] [place], Salvadore Oxford, NP   6 months ago Mixed hyperlipidemia   [place] [facility] [place], Salvadore Oxford, NP   1 year ago Encounter for routine adult medical exam with abnormal findings   [facility] [place], Salvadore Oxford, NP   1 year ago Preoperative clearance   [place] [facility] [place], Salvadore Oxford, NP   1 year ago Direct left inguinal hernia   [place] [facility] [place], Salvadore Oxford, NP       Future Appointments             In 3 weeks [place], Arlys John, MD [facility] [place]   In 4 months Baity, Salvadore Oxford, NP [place] [facility], [facility]               Requested Prescriptions  Pending Prescriptions Disp Refills   traMADol (ULTRAM) 50 MG tablet [Pharmacy Med Name: TRAMADOL HCL 50 MG TABLET] 60 tablet 0    Sig: TAKE 1 TABLET BY MOUTH 2 TIMES DAILY AS NEEDED.     Not Delegated - Analgesics:  Opioid Agonists Failed - 04/04/2023  8:51 AM      Failed - This refill cannot be delegated      Failed - Urine Drug Screen completed in last 360  days      Passed - Valid encounter within last 3 months    Recent Outpatient Visits           1 month ago Encounter for general adult medical examination with abnormal findings   [place] [facility] [place], Salvadore Oxford, NP   6 months ago Mixed hyperlipidemia   [place] [facility] [place], Salvadore Oxford, NP   1 year ago Encounter for routine adult medical exam with abnormal findings   [place] [facility] [place], Salvadore Oxford, NP   1 year ago Preoperative clearance   [place] [facility] [place], Salvadore Oxford, NP   1 year ago Direct left inguinal hernia   [place] [facility] [place], Salvadore Oxford, NP       Future Appointments             In 3 weeks Agbor-Etang, Arlys John, MD [facility] at [place]   In 4 months Baity, Salvadore Oxford, NP  [facility], [facility]

## 2023-05-01 ENCOUNTER — Encounter: Payer: Self-pay | Admitting: Cardiology

## 2023-05-01 ENCOUNTER — Ambulatory Visit: Payer: Medicare Other | Attending: Cardiology | Admitting: Cardiology

## 2023-05-01 VITALS — BP 130/68 | HR 59 | Ht 69.0 in | Wt 197.2 lb

## 2023-05-01 DIAGNOSIS — I480 Paroxysmal atrial fibrillation: Secondary | ICD-10-CM | POA: Diagnosis not present

## 2023-05-01 DIAGNOSIS — R079 Chest pain, unspecified: Secondary | ICD-10-CM

## 2023-05-01 DIAGNOSIS — E782 Mixed hyperlipidemia: Secondary | ICD-10-CM

## 2023-05-01 DIAGNOSIS — Z720 Tobacco use: Secondary | ICD-10-CM | POA: Diagnosis not present

## 2023-05-01 DIAGNOSIS — I2510 Atherosclerotic heart disease of native coronary artery without angina pectoris: Secondary | ICD-10-CM | POA: Diagnosis not present

## 2023-05-01 NOTE — Progress Notes (Signed)
 Cardiology Office Note:    Date:  05/01/2023   ID:  Jeremy Turner, DOB 1965/10/26, MRN [id number]  PCP:  Lorre Munroe, NP   [facility] Providers Cardiologist:  Debbe Odea, MD     Referring MD: Lorre Munroe, NP   No chief complaint on file.   History of Present Illness:    Jeremy Turner is a 58 y.o. male with a hx of nonobstructive CAD (LHC-03/2019-30% left circumflex disease ), paroxysmal atrial fibrillation, hyperlipidemia, smoker x40+ years who presents for follow-up.    Previously seen with symptoms of chest pain consistent with GERD.  Started on Protonix with good effect.  States symptoms have significantly improved.  Lipitor also increased to 40 mg daily, he is tolerating Lipitor with no adverse effects.  Feels well, has no concerns at this time.  He still smokes, is working on quitting.  Prior notes Echo 12/2021 EF 50 to 55% -echocardiogram 08/2020 EF 55% apparently had a stress test back in 2021 which was abnormal,  left heart cath 03/2019 showing 30% left circumflex disease.   Past Medical History:  Diagnosis Date   Aortic atherosclerosis (HCC)    Bilateral inguinal hernia    CAD (coronary artery disease) 03/2019   a.) LHC (done in [place]): 30% LCx disease - med mgmt.   Cardiac murmur    COPD (chronic obstructive pulmonary disease) (HCC)    DDD (degenerative disc disease), lumbar    HLD (hyperlipidemia)    Long term current use of anticoagulant    a.) apixaban   Marijuana use    a.) UDS (+) for [facility] 05/2021   PAF (paroxysmal atrial fibrillation) (HCC)    a.) CHA2DS2VASc = 1 (vascular disease history);  b.) rate/rhythm maintained on oral metoprolol succinate; chronically anticoagulated with apixaban   Stenosis of lateral recess of lumbar spine    a.) s/p laminectomy with L3-L5 posterior and interbody fusion   Uncomplicated opioid use    a.) long term Tramadol Rx'd by PCP; signed OUA on file    Past Surgical History:  Procedure  Laterality Date   CARDIAC CATHETERIZATION  03/2019   COLONOSCOPY WITH PROPOFOL N/A 04/11/2022   Procedure: COLONOSCOPY WITH PROPOFOL;  Surgeon: Toney Reil, MD;  Location: [facility] ENDOSCOPY;  Service: Gastroenterology;  Laterality: N/A;   Herina repair  10/2021   PATELLA FRACTURE SURGERY Left    1990's   POSTERIOR LUMBAR FUSION  09/2020   Procedure: LAMINECTOMY;  L3-L5 POSTERIOR AND INTERBODY FUSION    Current Medications: Current Meds  Medication Sig   acetaminophen (TYLENOL) 500 MG tablet Take 2 tablets (1,000 mg total) by mouth every 6 (six) hours as needed for mild pain.   albuterol (PROVENTIL) (2.5 MG/3ML) 0.083% nebulizer solution SMARTSIG:1 Vial(s) Via Nebulizer 4 Times Daily PRN   albuterol (VENTOLIN HFA) 108 (90 Base) MCG/ACT inhaler SMARTSIG:2 Puff(s) Via Inhaler 3 Times Daily PRN   atorvastatin (LIPITOR) 40 MG tablet Take 1 tablet (40 mg total) by mouth daily.   diclofenac Sodium (VOLTAREN) 1 % GEL Apply 2 g topically 4 (four) times daily.   ELIQUIS 5 MG TABS tablet TAKE 1 TABLET BY MOUTH TWICE A DAY AS DIRECTED   metoprolol succinate (TOPROL-XL) 25 MG 24 hr tablet TAKE 1 TABLET BY MOUTH TWICE A DAY   mupirocin cream (BACTROBAN) 2 % Apply 1 Application topically 2 (two) times daily.   pantoprazole (PROTONIX) 40 MG tablet Take 1 tablet (40 mg total) by mouth daily.   traMADol (ULTRAM) 50 MG tablet  Take 1 tablet (50 mg total) by mouth 2 (two) times daily as needed.   TRELEGY ELLIPTA 100-62.5-25 MCG/ACT AEPB INHALE 1 PUFF BY MOUTH EVERY DAY     Allergies:   Cefadroxil   Social History   Socioeconomic History   Marital status: Significant Other    Spouse name: Harriett Sine   Number of children: Not on file   Years of education: Not on file   Highest education level: Associate degree: occupational, Scientist, product/process development, or vocational program  Occupational History   Not on file  Tobacco Use   Smoking status: Every Day    Current packs/day: 0.10    Types: Cigarettes    Passive  exposure: Past   Smokeless tobacco: Never   Tobacco comments:    3-4 cigarettes per day current 11-02-21  Vaping Use   Vaping status: Never Used  Substance and Sexual Activity   Alcohol use: Not Currently   Drug use: Not Currently    Types: Marijuana    Comment: daily   Sexual activity: Not on file  Other Topics Concern   Not on file  Social History Narrative   Not on file   Social Drivers of Health   Financial Resource Strain: High Risk (02/03/2023)   Overall Financial Resource Strain (CARDIA)    Difficulty of Paying Living Expenses: Hard  Food Insecurity: No Food Insecurity (02/03/2023)   Hunger Vital Sign    Worried About Running Out of Food in the Last Year: Never true    Ran Out of Food in the Last Year: Never true  Transportation Needs: No Transportation Needs (02/03/2023)   PRAPARE - Administrator, Civil Service (Medical): No    Lack of Transportation (Non-Medical): No  Physical Activity: Sufficiently Active (02/03/2023)   Exercise Vital Sign    Days of Exercise per Week: 7 days    Minutes of Exercise per Session: 30 min  Stress: No Stress Concern Present (02/03/2023)   [employer] of Occupational Health - Occupational Stress Questionnaire    Feeling of Stress : Not at all  Social Connections: Moderately Integrated (02/03/2023)   Social Connection and Isolation Panel [NHANES]    Frequency of Communication with Friends and Family: More than three times a week    Frequency of Social Gatherings with Friends and Family: More than three times a week    Attends Religious Services: Never    Database administrator or Organizations: Yes    Attends Engineer, structural: More than 4 times per year    Marital Status: Living with partner     Family History: The patient's family history includes Healthy in his brother and sister; Heart disease in his father. There is no history of Colon cancer or Prostate cancer.  ROS:   Please see the history of present  illness.     All other systems reviewed and are negative.  EKGs/Labs/Other Studies Reviewed:    The following studies were reviewed today:   EKG Interpretation Date/Time:  Wednesday May 01 2023 08:44:50 EDT Ventricular Rate:  59 PR Interval:  162 QRS Duration:  100 QT Interval:  400 QTC Calculation: 396 R Axis:   27  Text Interpretation: Sinus bradycardia with frequent Premature ventricular complexes Confirmed by Debbe Odea ([id number]) on 05/01/2023 8:51:40 AM    Recent Labs: 02/04/2023: ALT 30; BUN 14; Creat 0.94; Hemoglobin 16.5; Platelets 257; Potassium 4.4; Sodium 139  Recent Lipid Panel    Component Value Date/Time   CHOL 202 (H) 02/04/2023  4259   TRIG 168 (H) 02/04/2023 0822   HDL 55 02/04/2023 0822   CHOLHDL 3.7 02/04/2023 0822   LDLCALC 119 (H) 02/04/2023 0822     Risk Assessment/Calculations:             Physical Exam:    VS:  BP 130/68 (BP Location: Left Arm, Patient Position: Sitting, Cuff Size: Normal)   Pulse (!) 59   Ht 5\' 9"  (1.753 m)   Wt 197 lb 3.2 oz (89.4 kg)   SpO2 97%   BMI 29.12 kg/m     Wt Readings from Last 3 Encounters:  05/01/23 197 lb 3.2 oz (89.4 kg)  03/19/23 195 lb 6.4 oz (88.6 kg)  02/04/23 196 lb 12.8 oz (89.3 kg)     GEN:  Well nourished, well developed in no acute distress HEENT: Normal NECK: No JVD; No carotid bruits CARDIAC: RRR, no murmurs, rubs, gallops RESPIRATORY:  Clear to auscultation without rales, wheezing or rhonchi  ABDOMEN: Soft, non-tender, non-distended MUSCULOSKELETAL:  No edema; No deformity  SKIN: Warm and dry NEUROLOGIC:  Alert and oriented x 3 PSYCHIATRIC:  Normal affect   ASSESSMENT:    1. Chest pain, unspecified type   2. Coronary artery disease involving native coronary artery of native heart without angina pectoris   3. Paroxysmal atrial fibrillation (HCC)   4. Mixed hyperlipidemia   5. Smoking trying to quit    PLAN:    In order of problems listed above:  Chest pain, resolved with  Protonix.  History of burping, hiatal hernia s/p hernia repair.  Symptoms consistent with GI etiology.  Continue Protonix 40 mg daily.   Nonobstructive CAD, 30% left circumflex disease.  EF 50 to 55%.  Continue Eliquis 5 mg twice daily, Lipitor 40 mg daily. Paroxysmal atrial fibrillation, currently in sinus.  Continue Toprol-XL 25 mg daily, Eliquis 5 mg twice daily. Hyperlipidemia, continue Lipitor 40 mg daily.  Obtain fasting lipid profile. Current smoker, smoking cessation advised.  Follow-up in 6 months.    Medication Adjustments/Labs and Tests Ordered: Current medicines are reviewed at length with the patient today.  Concerns regarding medicines are outlined above.  Orders Placed This Encounter  Procedures   Lipid panel   EKG 12-Lead   No orders of the defined types were placed in this encounter.   Patient Instructions  Medication Instructions:   Your Physician recommend you continue on your current medication as directed.     *If you need a refill on your cardiac medications before your next appointment, please call your pharmacy*  Lab Work:  Your provider would like for you to have following labs drawn today Lipid Panel.     If you have labs (blood work) drawn today and your tests are completely normal, you will receive your results only by: MyChart Message (if you have MyChart) OR A paper copy in the mail If you have any lab test that is abnormal or we need to change your treatment, we will call you to review the results.  Testing/Procedures: None ordered.   Follow-Up: At [facility], you and your health needs are our priority.  As part of our continuing mission to provide you with exceptional heart care, our providers are all part of one team.  This team includes your primary Cardiologist (physician) and Advanced Practice Providers or APPs (Physician Assistants and Nurse Practitioners) who all work together to provide you with the care you need, when you need  it.  Your next appointment:   6  month(s)  Provider:   You may see Debbe Odea, MD or one of the following Advanced Practice Providers on your designated Care Team:   Nicolasa Ducking, NP Ames Dura, PA-C Eula Listen, PA-C Cadence Imboden, PA-C Charlsie Quest, NP Carlos Levering, NP    We recommend signing up for the patient portal called "MyChart".  Sign up information is provided on this After Visit Summary.  MyChart is used to connect with patients for Virtual Visits (Telemedicine).  Patients are able to view lab/test results, encounter notes, upcoming appointments, etc.  Non-urgent messages can be sent to your provider as well.   To learn more about what you can do with MyChart, go to [email or web address].            Signed, Debbe Odea, MD  05/01/2023 9:49 AM    [facility]

## 2023-05-01 NOTE — Patient Instructions (Signed)
 Medication Instructions:   Your Physician recommend you continue on your current medication as directed.     *If you need a refill on your cardiac medications before your next appointment, please call your pharmacy*  Lab Work:  Your provider would like for you to have following labs drawn today Lipid Panel.     If you have labs (blood work) drawn today and your tests are completely normal, you will receive your results only by: MyChart Message (if you have MyChart) OR A paper copy in the mail If you have any lab test that is abnormal or we need to change your treatment, we will call you to review the results.  Testing/Procedures: None ordered.   Follow-Up: At [facility], you and your health needs are our priority.  As part of our continuing mission to provide you with exceptional heart care, our providers are all part of one team.  This team includes your primary Cardiologist (physician) and Advanced Practice Providers or APPs (Physician Assistants and Nurse Practitioners) who all work together to provide you with the care you need, when you need it.  Your next appointment:   6 month(s)  Provider:   You may see Debbe Odea, MD or one of the following Advanced Practice Providers on your designated Care Team:   Nicolasa Ducking, NP Ames Dura, PA-C Eula Listen, PA-C Cadence [place], PA-C Charlsie Quest, NP Carlos Levering, NP    We recommend signing up for the patient portal called "MyChart".  Sign up information is provided on this After Visit Summary.  MyChart is used to connect with patients for Virtual Visits (Telemedicine).  Patients are able to view lab/test results, encounter notes, upcoming appointments, etc.  Non-urgent messages can be sent to your provider as well.   To learn more about what you can do with MyChart, go to [email or web address].

## 2023-05-02 ENCOUNTER — Other Ambulatory Visit: Payer: Self-pay | Admitting: Internal Medicine

## 2023-05-02 LAB — LIPID PANEL
Chol/HDL Ratio: 3.3 ratio (ref 0.0–5.0)
Cholesterol, Total: 174 mg/dL (ref 100–199)
HDL: 53 mg/dL (ref >39)
LDL Chol Calc (NIH): 95 mg/dL (ref 0–99)
Triglycerides: 151 mg/dL — ABNORMAL HIGH (ref 0–149)
VLDL Cholesterol Cal: 26 mg/dL (ref 5–40)

## 2023-05-03 NOTE — Telephone Encounter (Signed)
 Requested medication (s) are due for refill today: yes  Requested medication (s) are on the active medication list: yes  Last refill:  04/04/23 #60/0  Future visit scheduled: yes  Notes to clinic:  Unable to refill per protocol, cannot delegate.      Requested Prescriptions  Pending Prescriptions Disp Refills   traMADol (ULTRAM) 50 MG tablet [Pharmacy Med Name: TRAMADOL HCL 50 MG TABLET] 60 tablet 0    Sig: TAKE 1 TABLET BY MOUTH 2 TIMES DAILY AS NEEDED.     Not Delegated - Analgesics:  Opioid Agonists Failed - 05/03/2023  1:49 PM      Failed - This refill cannot be delegated      Failed - Urine Drug Screen completed in last 360 days      Failed - Valid encounter within last 3 months    Recent Outpatient Visits   None     Future Appointments             In 3 months Baity, Salvadore Oxford, NP [facility], [facility]

## 2023-05-06 ENCOUNTER — Encounter: Payer: Self-pay | Admitting: *Deleted

## 2023-05-09 ENCOUNTER — Telehealth: Payer: Self-pay | Admitting: *Deleted

## 2023-05-09 MED ORDER — ATORVASTATIN CALCIUM 40 MG PO TABS: 40.0000 mg | ORAL_TABLET | Freq: Every day | ORAL | 0 refills | Status: DC

## 2023-05-09 NOTE — Telephone Encounter (Signed)
Pt has reviewed results via my chart  New script sent to the pharmacy

## 2023-05-09 NOTE — Telephone Encounter (Signed)
-----   Message from [place] Agbor-Etang sent at 05/04/2023  3:47 PM EDT ----- LDL not at goal, triglycerides elevated.  Increase Lipitor 80 mg daily.

## 2023-05-29 ENCOUNTER — Other Ambulatory Visit: Payer: Self-pay | Admitting: Internal Medicine

## 2023-05-30 ENCOUNTER — Other Ambulatory Visit: Payer: Self-pay | Admitting: Internal Medicine

## 2023-05-30 ENCOUNTER — Other Ambulatory Visit: Payer: Self-pay | Admitting: Cardiology

## 2023-05-30 ENCOUNTER — Other Ambulatory Visit: Payer: Self-pay

## 2023-05-30 MED ORDER — TRAMADOL HCL 50 MG PO TABS: 50.0000 mg | ORAL_TABLET | Freq: Two times a day (BID) | ORAL | 0 refills | Status: DC | PRN

## 2023-05-30 MED ORDER — METOPROLOL SUCCINATE ER 25 MG PO TB24: 25.0000 mg | ORAL_TABLET | Freq: Two times a day (BID) | ORAL | 0 refills | Status: DC

## 2023-05-30 MED ORDER — ATORVASTATIN CALCIUM 80 MG PO TABS: 80.0000 mg | ORAL_TABLET | Freq: Every day | ORAL | 1 refills | Status: DC

## 2023-05-30 MED ORDER — APIXABAN 5 MG PO TABS: 5.0000 mg | ORAL_TABLET | Freq: Two times a day (BID) | ORAL | 0 refills | Status: DC

## 2023-05-31 NOTE — Telephone Encounter (Signed)
 Duplicate request, Rx ordered 05/30/23 Requested Prescriptions  Pending Prescriptions Disp Refills   metoprolol  succinate (TOPROL -XL) 25 MG 24 hr tablet [Pharmacy Med Name: METOPROLOL  SUCC ER 25 MG TAB] 180 tablet 0    Sig: TAKE 1 TABLET BY MOUTH TWICE A DAY     Cardiovascular:  Beta Blockers Failed - 05/31/2023  3:54 PM      Failed - Valid encounter within last 6 months    Recent Outpatient Visits   None     Future Appointments             In 2 months Baity, Rankin Buzzard, NP [place] [facility], [facility]            Passed - Last BP in normal range    BP Readings from Last 1 Encounters:  05/01/23 130/68         Passed - Last Heart Rate in normal range    Pulse Readings from Last 1 Encounters:  05/01/23 (!) 59          ELIQUIS  5 MG TABS tablet [Pharmacy Med Name: ELIQUIS  5 MG TABLET] 180 tablet 0    Sig: TAKE 1 TABLET BY MOUTH TWICE A DAY AS DIRECTED     Hematology:  Anticoagulants - apixaban  Failed - 05/31/2023  3:54 PM      Failed - Valid encounter within last 12 months    Recent Outpatient Visits   None     Future Appointments             In 2 months Baity, Rankin Buzzard, NP [facility], [facility]            Passed - PLT in normal range and within 360 days    Platelets  Date Value Ref Range Status  02/04/2023 257 140 - 400 Thousand/uL Final         Passed - HGB in normal range and within 360 days    Hemoglobin  Date Value Ref Range Status  02/04/2023 16.5 13.2 - 17.1 g/dL Final         Passed - HCT in normal range and within 360 days    HCT  Date Value Ref Range Status  02/04/2023 48.4 38.5 - 50.0 % Final         Passed - Cr in normal range and within 360 days    Creat  Date Value Ref Range Status  02/04/2023 0.94 0.70 - 1.30 mg/dL Final         Passed - AST in normal range and within 360 days    AST  Date Value Ref Range Status  02/04/2023 23 10 - 35 U/L Final         Passed - ALT in normal range and within 360  days    ALT  Date Value Ref Range Status  02/04/2023 30 9 - 46 U/L Final

## 2023-06-03 ENCOUNTER — Other Ambulatory Visit: Payer: Self-pay | Admitting: Internal Medicine

## 2023-06-07 ENCOUNTER — Ambulatory Visit: Payer: Medicare Other

## 2023-06-07 DIAGNOSIS — Z0000 Encounter for general adult medical examination without abnormal findings: Secondary | ICD-10-CM

## 2023-06-07 NOTE — Progress Notes (Signed)
 Subjective:   Jeremy Turner is a 58 y.o. who presents for a Medicare Wellness preventive visit.  As a reminder, Annual Wellness Visits don't include a physical exam, and some assessments may be limited, especially if this visit is performed virtually. We may recommend an in-person visit if needed.  Visit Complete: Virtual I connected with  Jeremy Turner on 06/07/23 by a audio enabled telemedicine application and verified that I am speaking with the correct person using two identifiers.  Patient Location: Home  Provider Location: Office/Clinic  I discussed the limitations of evaluation and management by telemedicine. The patient expressed understanding and agreed to proceed.  Vital Signs: Because this visit was a virtual/telehealth visit, some criteria may be missing or patient reported. Any vitals not documented were not able to be obtained and vitals that have been documented are patient reported.  VideoDeclined- This patient declined Librarian, academic. Therefore the visit was completed with audio only.  Persons Participating in Visit: Patient.  AWV Questionnaire: No: Patient Medicare AWV questionnaire was not completed prior to this visit.  Cardiac Risk Factors include: advanced age (>42men, >16 women);dyslipidemia;male gender;smoking/ tobacco exposure     Objective:     Today's Vitals   06/07/23 1247  PainSc: 4    There is no height or weight on file to calculate BMI.     06/07/2023   12:51 PM 05/11/2022   10:37 AM 04/11/2022    8:58 AM 11/14/2021    8:49 AM 11/06/2021    9:11 AM  Advanced Directives  Does Patient Have a Medical Advance Directive? No No No No No  Would patient like information on creating a medical advance directive? No - Patient declined No - Patient declined  No - Patient declined     Current Medications (verified) Outpatient Encounter Medications as of 06/07/2023  Medication Sig   acetaminophen  (TYLENOL ) 500 MG  tablet Take 2 tablets (1,000 mg total) by mouth every 6 (six) hours as needed for mild pain.   albuterol (PROVENTIL) (2.5 MG/3ML) 0.083% nebulizer solution SMARTSIG:1 Vial(s) Via Nebulizer 4 Times Daily PRN   albuterol (VENTOLIN HFA) 108 (90 Base) MCG/ACT inhaler SMARTSIG:2 Puff(s) Via Inhaler 3 Times Daily PRN   apixaban  (ELIQUIS ) 5 MG TABS tablet Take 1 tablet (5 mg total) by mouth 2 (two) times daily.   atorvastatin  (LIPITOR) 80 MG tablet Take 1 tablet (80 mg total) by mouth daily.   diclofenac Sodium (VOLTAREN) 1 % GEL Apply 2 g topically 4 (four) times daily.   metoprolol  succinate (TOPROL -XL) 25 MG 24 hr tablet Take 1 tablet (25 mg total) by mouth 2 (two) times daily.   mupirocin  cream (BACTROBAN ) 2 % Apply 1 Application topically 2 (two) times daily.   pantoprazole  (PROTONIX ) 40 MG tablet Take 1 tablet (40 mg total) by mouth daily.   traMADol  (ULTRAM ) 50 MG tablet Take 1 tablet (50 mg total) by mouth 2 (two) times daily as needed.   TRELEGY ELLIPTA  100-62.5-25 MCG/ACT AEPB INHALE 1 PUFF BY MOUTH EVERY DAY   No facility-administered encounter medications on file as of 06/07/2023.    Allergies (verified) Cefadroxil   History: Past Medical History:  Diagnosis Date   Aortic atherosclerosis (HCC)    Bilateral inguinal hernia    CAD (coronary artery disease) 03/2019   a.) LHC (done in [place]): 30% LCx disease - med mgmt.   Cardiac murmur    COPD (chronic obstructive pulmonary disease) (HCC)    DDD (degenerative disc disease), lumbar  HLD (hyperlipidemia)    Long term current use of anticoagulant    a.) apixaban    Marijuana use    a.) UDS (+) for [facility] 05/2021   PAF (paroxysmal atrial fibrillation) (HCC)    a.) CHA2DS2VASc = 1 (vascular disease history);  b.) rate/rhythm maintained on oral metoprolol  succinate; chronically anticoagulated with apixaban    Stenosis of lateral recess of lumbar spine    a.) s/p laminectomy with L3-L5 posterior and interbody fusion   Uncomplicated opioid use     a.) long term Tramadol  Rx'd by PCP; signed OUA on file   Past Surgical History:  Procedure Laterality Date   CARDIAC CATHETERIZATION  03/2019   COLONOSCOPY WITH PROPOFOL  N/A 04/11/2022   Procedure: COLONOSCOPY WITH PROPOFOL ;  Surgeon: Selena Daily, MD;  Location: [facility] ENDOSCOPY;  Service: Gastroenterology;  Laterality: N/A;   Herina repair  10/2021   PATELLA FRACTURE SURGERY Left    1990's   POSTERIOR LUMBAR FUSION  09/2020   Procedure: LAMINECTOMY;  L3-L5 POSTERIOR AND INTERBODY FUSION   Family History  Problem Relation Age of Onset   Heart disease Father    Healthy Sister    Healthy Brother    Colon cancer Neg Hx    Prostate cancer Neg Hx    Social History   Socioeconomic History   Marital status: Significant Other    Spouse name: Haskell Linker   Number of children: Not on file   Years of education: Not on file   Highest education level: Associate degree: occupational, Scientist, product/process development, or vocational program  Occupational History   Not on file  Tobacco Use   Smoking status: Every Day    Current packs/day: 0.10    Types: Cigarettes    Passive exposure: Past   Smokeless tobacco: Never   Tobacco comments:    3-4 cigarettes per day current 11-02-21  Vaping Use   Vaping status: Never Used  Substance and Sexual Activity   Alcohol use: Not Currently   Drug use: Not Currently    Types: Marijuana    Comment: daily   Sexual activity: Not on file  Other Topics Concern   Not on file  Social History Narrative   Not on file   Social Drivers of Health   Financial Resource Strain: Low Risk  (06/07/2023)   Overall Financial Resource Strain (CARDIA)    Difficulty of Paying Living Expenses: Not very hard  Food Insecurity: No Food Insecurity (06/07/2023)   Hunger Vital Sign    Worried About Running Out of Food in the Last Year: Never true    Ran Out of Food in the Last Year: Never true  Transportation Needs: No Transportation Needs (06/07/2023)   PRAPARE - Scientist, research (physical sciences) (Medical): No    Lack of Transportation (Non-Medical): No  Physical Activity: Sufficiently Active (06/07/2023)   Exercise Vital Sign    Days of Exercise per Week: 7 days    Minutes of Exercise per Session: 30 min  Stress: No Stress Concern Present (06/07/2023)   [employer] of Occupational Health - Occupational Stress Questionnaire    Feeling of Stress : Not at all  Social Connections: Moderately Integrated (06/07/2023)   Social Connection and Isolation Panel [NHANES]    Frequency of Communication with Friends and Family: More than three times a week    Frequency of Social Gatherings with Friends and Family: More than three times a week    Attends Religious Services: Never    Database administrator or  Organizations: Yes    Attends Engineer, structural: More than 4 times per year    Marital Status: Living with partner    Tobacco Counseling Ready to quit: Not Answered Counseling given: Not Answered Tobacco comments: 3-4 cigarettes per day current 11-02-21    Clinical Intake:  Pre-visit preparation completed: Yes  Pain : 0-10 Pain Score: 4  Pain Type: Chronic pain Pain Location: Back Pain Orientation: Lower Pain Descriptors / Indicators: Aching, Discomfort, Constant Pain Onset: More than a month ago Pain Frequency: Constant Pain Relieving Factors: TYLENOL , TRAMADOL , WALKING  Pain Relieving Factors: TYLENOL , TRAMADOL , WALKING  BMI - recorded: 29.1 Nutritional Status: BMI 25 -29 Overweight Nutritional Risks: None Diabetes: No  Lab Results  Component Value Date   HGBA1C 5.9 (H) 02/04/2023   HGBA1C 5.8 (H) 10/04/2022   HGBA1C 5.4 10/30/2021     How often do you need to have someone help you when you read instructions, pamphlets, or other written materials from your doctor or pharmacy?: 1 - Never  Interpreter Needed?: No  Information entered by :: Dellie Fergusson, LPN   Activities of Daily Living     06/07/2023   12:52 PM 06/06/2023    9:19 AM   In your present state of health, do you have any difficulty performing the following activities:  Hearing? 0 0  Vision? 0 0  Difficulty concentrating or making decisions? 0 0  Walking or climbing stairs? 0 0  Dressing or bathing? 0 0  Doing errands, shopping? 0 0  Preparing Food and eating ? N N  Using the Toilet? N N  In the past six months, have you accidently leaked urine? N N  Do you have problems with loss of bowel control? N N  Managing your Medications? N N  Managing your Finances? N N  Housekeeping or managing your Housekeeping? N N    Patient Care Team: Carollynn Cirri, NP as PCP - General (Internal Medicine) Constancia Delton, MD as PCP - Cardiology (Cardiology)  Indicate any recent Medical Services you may have received from other than [facility] providers in the past year (date may be approximate).     Assessment:    This is a routine wellness examination for [place].  Hearing/Vision screen Hearing Screening - Comments:: NO AIDS Vision Screening - Comments:: READERS-   Goals Addressed             This Visit's Progress    DIET - INCREASE WATER  INTAKE         Depression Screen     06/07/2023   12:49 PM 02/04/2023    8:18 AM 10/04/2022    9:22 AM 05/11/2022   10:35 AM 02/01/2022    9:51 AM 10/30/2021   10:27 AM 10/06/2021    2:12 PM  PHQ 2/9 Scores  PHQ - 2 Score 0 0 0 0 0 0 0  PHQ- 9 Score 0   0  0 0    Fall Risk     06/07/2023   12:52 PM 06/06/2023    9:19 AM 02/04/2023    8:18 AM 10/04/2022    9:22 AM 05/11/2022   10:37 AM  Fall Risk   Falls in the past year? 0 0 0 0 0  Number falls in past yr: 0 0   0  Injury with Fall? 0 0  0 0  Risk for fall due to : No Fall Risks   No Fall Risks No Fall Risks  Follow up Falls prevention discussed;Falls evaluation  completed    Falls prevention discussed;Falls evaluation completed    MEDICARE RISK AT HOME:  Medicare Risk at Home Any stairs in or around the home?: Yes If so, are there any without handrails?: No Home  free of loose throw rugs in walkways, pet beds, electrical cords, etc?: No Adequate lighting in your home to reduce risk of falls?: Yes Life alert?: No Use of a cane, walker or w/c?: No Grab bars in the bathroom?: No Shower chair or bench in shower?: No Elevated toilet seat or a handicapped toilet?: No  TIMED UP AND GO:  Was the test performed?  No  Cognitive Function: 6CIT completed        06/07/2023   12:53 PM 05/11/2022   10:42 AM  6CIT Screen  What Year? 0 points 0 points  What month? 0 points 0 points  What time? 0 points 0 points  Count back from 20 0 points 0 points  Months in reverse 0 points 0 points  Repeat phrase 0 points 0 points  Total Score 0 points 0 points    Immunizations  There is no immunization history on file for this patient.  Screening Tests Health Maintenance  Topic Date Due   COVID-19 Vaccine (1) Never done   DTaP/Tdap/Td (1 - Tdap) Never done   Pneumococcal Vaccine 28-69 Years old (1 of 2 - PCV) Never done   Zoster Vaccines- Shingrix (1 of 2) Never done   Lung Cancer Screening  Never done   INFLUENZA VACCINE  08/30/2023   Medicare Annual Wellness (AWV)  06/06/2024   Fecal DNA (Cologuard)  02/17/2025   Hepatitis C Screening  Completed   HIV Screening  Completed   HPV VACCINES  Aged Out   Meningococcal B Vaccine  Aged Out   Colonoscopy  Discontinued    Health Maintenance  Health Maintenance Due  Topic Date Due   COVID-19 Vaccine (1) Never done   DTaP/Tdap/Td (1 - Tdap) Never done   Pneumococcal Vaccine 44-60 Years old (1 of 2 - PCV) Never done   Zoster Vaccines- Shingrix (1 of 2) Never done   Lung Cancer Screening  Never done   Health Maintenance Items Addressed: UP TO DATE ON COLONOSCOPY; DECLINES VACCINES  Additional Screening:  Vision Screening: Recommended annual ophthalmology exams for early detection of glaucoma and other disorders of the eye.  Dental Screening: Recommended annual dental exams for proper oral  hygiene  Community Resource Referral / Chronic Care Management: CRR required this visit?  No   CCM required this visit?  No   Plan:    I have personally reviewed and noted the following in the patient's chart:   Medical and social history Use of alcohol, tobacco or illicit drugs  Current medications and supplements including opioid prescriptions. Patient is not currently taking opioid prescriptions. Functional ability and status Nutritional status Physical activity Advanced directives List of other physicians Hospitalizations, surgeries, and ER visits in previous 12 months Vitals Screenings to include cognitive, depression, and falls Referrals and appointments  In addition, I have reviewed and discussed with patient certain preventive protocols, quality metrics, and best practice recommendations. A written personalized care plan for preventive services as well as general preventive health recommendations were provided to patient.   Pinky Bright, LPN   02/03/1094   After Visit Summary: (MyChart) Due to this being a telephonic visit, the after visit summary with patients personalized plan was offered to patient via MyChart   Notes: Nothing significant to report at this  time.

## 2023-06-07 NOTE — Patient Instructions (Signed)
 Jeremy Turner , Thank you for taking time out of your busy schedule to complete your Annual Wellness Visit with me. I enjoyed our conversation and look forward to speaking with you again next year. I, as well as your care team,  appreciate your ongoing commitment to your health goals. Please review the following plan we discussed and let me know if I can assist you in the future.  Follow up Visits: Next Medicare AWV with our clinical staff: 06/12/24 @ 11:30 AM BY PHONE   Have you seen your provider in the last 6 months (3 months if uncontrolled diabetes)? Yes   Clinician Recommendations:  Aim for 30 minutes of exercise or brisk walking, 6-8 glasses of water , and 5 servings of fruits and vegetables each day. TAKE CARE!      This is a list of the screening recommended for you and due dates:  Health Maintenance  Topic Date Due   COVID-19 Vaccine (1) Never done   DTaP/Tdap/Td vaccine (1 - Tdap) Never done   Pneumococcal Vaccination (1 of 2 - PCV) Never done   Zoster (Shingles) Vaccine (1 of 2) Never done   Screening for Lung Cancer  Never done   Flu Shot  08/30/2023   Medicare Annual Wellness Visit  06/06/2024   Cologuard (Stool DNA test)  02/17/2025   Hepatitis C Screening  Completed   HIV Screening  Completed   HPV Vaccine  Aged Out   Meningitis B Vaccine  Aged Out   Colon Cancer Screening  Discontinued    Advanced directives: (ACP Link)Information on Advanced Care Planning can be found at [place]  Secretary of [facility] Advance Health Care Directives Advance Health Care Directives. [email or web address]  Advance Care Planning is important because it:  [x]  Makes sure you receive the medical care that is consistent with your values, goals, and preferences  [x]  It provides guidance to your family and loved ones and reduces their decisional burden about whether or not they are making the right decisions based on your wishes.  Follow the link provided in your after visit summary or read over the  paperwork we have mailed to you to help you started getting your Advance Directives in place. If you need assistance in completing these, please reach out to us  so that we can help you!

## 2023-06-18 ENCOUNTER — Other Ambulatory Visit: Payer: Self-pay

## 2023-06-18 MED ORDER — PANTOPRAZOLE SODIUM 40 MG PO TBEC: 40.0000 mg | DELAYED_RELEASE_TABLET | Freq: Every day | ORAL | 0 refills | Status: DC

## 2023-07-04 ENCOUNTER — Other Ambulatory Visit: Payer: Self-pay | Admitting: Internal Medicine

## 2023-07-05 NOTE — Telephone Encounter (Signed)
 Requested medication (s) are due for refill today yes  Requested medication (s) are on the active medication list -yes  Future visit scheduled -yes  Last refill: 05/30/23 #60  Notes to clinic: non delegated Rx  Requested Prescriptions  Pending Prescriptions Disp Refills   traMADol  (ULTRAM ) 50 MG tablet [Pharmacy Med Name: TRAMADOL  HCL 50 MG TABLET] 60 tablet 0    Sig: TAKE 1 TABLET BY MOUTH 2 TIMES DAILY AS NEEDED.     Not Delegated - Analgesics:  Opioid Agonists Failed - 07/05/2023 10:38 AM      Failed - This refill cannot be delegated      Failed - Urine Drug Screen completed in last 360 days      Passed - Valid encounter within last 3 months    Recent Outpatient Visits   None     Future Appointments             In 1 month Baity, Rankin Buzzard, NP [facility], [facility]               Requested Prescriptions  Pending Prescriptions Disp Refills   traMADol  (ULTRAM ) 50 MG tablet [Pharmacy Med Name: TRAMADOL  HCL 50 MG TABLET] 60 tablet 0    Sig: TAKE 1 TABLET BY MOUTH 2 TIMES DAILY AS NEEDED.     Not Delegated - Analgesics:  Opioid Agonists Failed - 07/05/2023 10:38 AM      Failed - This refill cannot be delegated      Failed - Urine Drug Screen completed in last 360 days      Passed - Valid encounter within last 3 months    Recent Outpatient Visits   None     Future Appointments             In 1 month Baity, Rankin Buzzard, NP [facility], [facility]

## 2023-08-01 ENCOUNTER — Other Ambulatory Visit: Payer: Self-pay | Admitting: Internal Medicine

## 2023-08-01 ENCOUNTER — Ambulatory Visit (INDEPENDENT_AMBULATORY_CARE_PROVIDER_SITE_OTHER): Admitting: Internal Medicine

## 2023-08-01 VITALS — BP 130/72 | Ht 69.0 in | Wt 195.2 lb

## 2023-08-01 DIAGNOSIS — M48061 Spinal stenosis, lumbar region without neurogenic claudication: Secondary | ICD-10-CM

## 2023-08-01 DIAGNOSIS — J410 Simple chronic bronchitis: Secondary | ICD-10-CM

## 2023-08-01 DIAGNOSIS — E663 Overweight: Secondary | ICD-10-CM

## 2023-08-01 DIAGNOSIS — R7303 Prediabetes: Secondary | ICD-10-CM | POA: Insufficient documentation

## 2023-08-01 DIAGNOSIS — E782 Mixed hyperlipidemia: Secondary | ICD-10-CM

## 2023-08-01 DIAGNOSIS — I4811 Longstanding persistent atrial fibrillation: Secondary | ICD-10-CM | POA: Diagnosis not present

## 2023-08-01 DIAGNOSIS — K219 Gastro-esophageal reflux disease without esophagitis: Secondary | ICD-10-CM | POA: Insufficient documentation

## 2023-08-01 DIAGNOSIS — Z6828 Body mass index (BMI) 28.0-28.9, adult: Secondary | ICD-10-CM

## 2023-08-01 LAB — COMPREHENSIVE METABOLIC PANEL WITH GFR
AG Ratio: 1.9 (calc) (ref 1.0–2.5)
ALT: 44 U/L (ref 9–46)
AST: 29 U/L (ref 10–35)
Albumin: 4.4 g/dL (ref 3.6–5.1)
Alkaline phosphatase (APISO): 62 U/L (ref 35–144)
BUN: 13 mg/dL (ref 7–25)
CO2: 25 mmol/L (ref 20–32)
Calcium: 9.2 mg/dL (ref 8.6–10.3)
Chloride: 107 mmol/L (ref 98–110)
Creat: 0.91 mg/dL (ref 0.70–1.30)
Globulin: 2.3 g/dL (ref 1.9–3.7)
Glucose, Bld: 90 mg/dL (ref 65–139)
Potassium: 4.3 mmol/L (ref 3.5–5.3)
Sodium: 140 mmol/L (ref 135–146)
Total Bilirubin: 0.4 mg/dL (ref 0.2–1.2)
Total Protein: 6.7 g/dL (ref 6.1–8.1)
eGFR: 98 mL/min/{1.73_m2} (ref >=60)

## 2023-08-01 LAB — CBC
HCT: 45.7 % (ref 38.5–50.0)
Hemoglobin: 14.9 g/dL (ref 13.2–17.1)
MCH: 29 pg (ref 27.0–33.0)
MCHC: 32.6 g/dL (ref 32.0–36.0)
MCV: 89.1 fL (ref 80.0–100.0)
MPV: 9.9 fL (ref 7.5–12.5)
Platelets: 212 Thousand/uL (ref 140–400)
RBC: 5.13 Million/uL (ref 4.20–5.80)
RDW: 12.8 % (ref 11.0–15.0)
WBC: 6.3 Thousand/uL (ref 3.8–10.8)

## 2023-08-01 LAB — HEMOGLOBIN A1C
Hgb A1c MFr Bld: 5.9 % — ABNORMAL HIGH
Mean Plasma Glucose: 123 mg/dL
eAG (mmol/L): 6.8 mmol/L

## 2023-08-01 MED ORDER — METHOCARBAMOL 500 MG PO TABS: 500.0000 mg | ORAL_TABLET | Freq: Every evening | ORAL | 1 refills | Status: DC | PRN

## 2023-08-01 NOTE — Assessment & Plan Note (Signed)
 Encourage smoking cessation Continue trelegy 100-60 2.5-25 mcg and albuterol as previously prescribed He declines prevnar 20 or lung cancer screening at this time

## 2023-08-01 NOTE — Assessment & Plan Note (Signed)
 A1c today Encourage low-carb diet and exercise for weight loss

## 2023-08-01 NOTE — Assessment & Plan Note (Signed)
 Encouraged diet and exercise for weight loss ?

## 2023-08-01 NOTE — Assessment & Plan Note (Signed)
 Encouraged regular stretching and core strengthening Continue diclofenac gel 1% 4 times daily as needed and tramadol  50 mg twice daily as previously prescribed Rx for methocarbamol 500 mg at bedtime as needed for new upper back pain that seems muscular in origin-sedation caution given

## 2023-08-01 NOTE — Assessment & Plan Note (Signed)
 Continue metoprolol  25 mg and apixaban  5 mg twice daily He will continue to follow with cardiology

## 2023-08-01 NOTE — Assessment & Plan Note (Signed)
 C-Met today, lipid profile reviewed Encouraged him to consume a low-fat diet Continue atorvastatin  80 mg daily

## 2023-08-01 NOTE — Patient Instructions (Signed)

## 2023-08-01 NOTE — Progress Notes (Signed)
 Subjective:    Patient ID: Jeremy Turner, male    DOB: 09-18-1965, 58 y.o.   MRN: [id number]  HPI  Patient presents to clinic today for follow-up of chronic conditions.  Lumbar spinal stenosis: Status post laminectomy in 2022.  He reports he has been having some upper back pain lately.  He is using diclofenac gel, and tramadol  as prescribed.  X-ray lumbar spine from 04/2021 reviewed.  He does not follow with pain management.  COPD: He denies chronic cough or shortness of breath.  He is using trelegy and albuterol as prescribed.  There are no PFTs on file.  He smokes occasionally.  HLD: His last LDL was 95, triglycerides 848, 04/2023.  He denies myalgias on atorvastatin .  He tries to consume a low-fat diet.  A-fib: Managed with metoprolol  and eliquis .  ECG from 04/2023 reviewed.  He follows with cardiology.  GERD: Triggered by medication.  He denies breakthrough on pantoprazole .  There is no upper GI on file.  Prediabetes: His last A1c was 5.9%, 01/2023.  He is not taking any oral diabetic medication at this time.  He does not check his sugars.  Review of Systems     Past Medical History:  Diagnosis Date   Aortic atherosclerosis (HCC)    Bilateral inguinal hernia    CAD (coronary artery disease) 03/2019   a.) LHC (done in [place]): 30% LCx disease - med mgmt.   Cardiac murmur    COPD (chronic obstructive pulmonary disease) (HCC)    DDD (degenerative disc disease), lumbar    HLD (hyperlipidemia)    Long term current use of anticoagulant    a.) apixaban    Marijuana use    a.) UDS (+) for [facility] 05/2021   PAF (paroxysmal atrial fibrillation) (HCC)    a.) CHA2DS2VASc = 1 (vascular disease history);  b.) rate/rhythm maintained on oral metoprolol  succinate; chronically anticoagulated with apixaban    Stenosis of lateral recess of lumbar spine    a.) s/p laminectomy with L3-L5 posterior and interbody fusion   Uncomplicated opioid use    a.) long term Tramadol  Rx'd by PCP; signed OUA on  file    Current Outpatient Medications  Medication Sig Dispense Refill   acetaminophen  (TYLENOL ) 500 MG tablet Take 2 tablets (1,000 mg total) by mouth every 6 (six) hours as needed for mild pain.     albuterol (PROVENTIL) (2.5 MG/3ML) 0.083% nebulizer solution SMARTSIG:1 Vial(s) Via Nebulizer 4 Times Daily PRN     albuterol (VENTOLIN HFA) 108 (90 Base) MCG/ACT inhaler SMARTSIG:2 Puff(s) Via Inhaler 3 Times Daily PRN     apixaban  (ELIQUIS ) 5 MG TABS tablet Take 1 tablet (5 mg total) by mouth 2 (two) times daily. 180 tablet 0   atorvastatin  (LIPITOR) 80 MG tablet Take 1 tablet (80 mg total) by mouth daily. 90 tablet 1   diclofenac Sodium (VOLTAREN) 1 % GEL Apply 2 g topically 4 (four) times daily.     metoprolol  succinate (TOPROL -XL) 25 MG 24 hr tablet Take 1 tablet (25 mg total) by mouth 2 (two) times daily. 180 tablet 0   mupirocin  cream (BACTROBAN ) 2 % Apply 1 Application topically 2 (two) times daily. 15 g 0   pantoprazole  (PROTONIX ) 40 MG tablet Take 1 tablet (40 mg total) by mouth daily. 90 tablet 0   traMADol  (ULTRAM ) 50 MG tablet Take 1 tablet (50 mg total) by mouth 2 (two) times daily as needed. 60 tablet 0   TRELEGY ELLIPTA  100-62.5-25 MCG/ACT AEPB INHALE 1 PUFF BY MOUTH  EVERY DAY 60 each 4   No current facility-administered medications for this visit.    Allergies  Allergen Reactions   Cefadroxil Hives and Palpitations    Family History  Problem Relation Age of Onset   Heart disease Father    Healthy Sister    Healthy Brother    Colon cancer Neg Hx    Prostate cancer Neg Hx     Social History   Socioeconomic History   Marital status: Significant Other    Spouse name: Inocente   Number of children: Not on file   Years of education: Not on file   Highest education level: Associate degree: occupational, Scientist, product/process development, or vocational program  Occupational History   Not on file  Tobacco Use   Smoking status: Every Day    Current packs/day: 0.10    Types: Cigarettes     Passive exposure: Past   Smokeless tobacco: Never   Tobacco comments:    3-4 cigarettes per day current 11-02-21  Vaping Use   Vaping status: Never Used  Substance and Sexual Activity   Alcohol use: Not Currently   Drug use: Not Currently    Types: Marijuana    Comment: daily   Sexual activity: Not on file  Other Topics Concern   Not on file  Social History Narrative   Not on file   Social Drivers of Health   Financial Resource Strain: Medium Risk (08/01/2023)   Overall Financial Resource Strain (CARDIA)    Difficulty of Paying Living Expenses: Somewhat hard  Food Insecurity: No Food Insecurity (08/01/2023)   Hunger Vital Sign    Worried About Running Out of Food in the Last Year: Never true    Ran Out of Food in the Last Year: Never true  Transportation Needs: No Transportation Needs (08/01/2023)   PRAPARE - Administrator, Civil Service (Medical): No    Lack of Transportation (Non-Medical): No  Physical Activity: Insufficiently Active (08/01/2023)   Exercise Vital Sign    Days of Exercise per Week: 7 days    Minutes of Exercise per Session: 20 min  Stress: No Stress Concern Present (08/01/2023)   [employer] of Occupational Health - Occupational Stress Questionnaire    Feeling of Stress: Not at all  Social Connections: Moderately Isolated (08/01/2023)   Social Connection and Isolation Panel    Frequency of Communication with Friends and Family: More than three times a week    Frequency of Social Gatherings with Friends and Family: More than three times a week    Attends Religious Services: Never    Database administrator or Organizations: No    Attends Engineer, structural: Not on file    Marital Status: Living with partner  Intimate Partner Violence: Not At Risk (06/07/2023)   Humiliation, Afraid, Rape, and Kick questionnaire    Fear of Current or Ex-Partner: No    Emotionally Abused: No    Physically Abused: No    Sexually Abused: No      Constitutional: Denies fever, malaise, fatigue, headache or abrupt weight changes.  HEENT: Denies eye pain, eye redness, ear pain, ringing in the ears, wax buildup, runny nose, nasal congestion, bloody nose, or sore throat. Respiratory: Denies difficulty breathing, shortness of breath, cough or sputum production.   Cardiovascular: Pt reports palpitations. Denies chest pain, chest tightness, palpitations or swelling in the hands or feet.  Gastrointestinal: Patient reports intermittent reflux.  Denies abdominal pain, bloating, constipation, diarrhea or blood in the stool.  GU: Denies urgency, frequency, pain with urination, burning sensation, blood in urine, odor or discharge. Musculoskeletal: Patient reports chronic low back pain, upper back pain.  Denies decrease in range of motion, difficulty with gait, or joint swelling.  Skin: Denies redness, rashes, lesions or ulcercations.  Neurological: Denies dizziness, difficulty with memory, difficulty with speech or problems with balance and coordination.  Psych: Denies anxiety, depression, SI/HI.  No other specific complaints in a complete review of systems (except as listed in HPI above).  Objective:   Physical Exam BP 130/72 (BP Location: Left Arm, Patient Position: Sitting, Cuff Size: Normal)   Ht 5' 9 (1.753 m)   Wt 195 lb 3.2 oz (88.5 kg)   BMI 28.83 kg/m    Wt Readings from Last 3 Encounters:  05/01/23 197 lb 3.2 oz (89.4 kg)  03/19/23 195 lb 6.4 oz (88.6 kg)  02/04/23 196 lb 12.8 oz (89.3 kg)    General: Appears his stated age, overweight, in NAD. Skin: Warm, dry and intact.  Cardiovascular: Bradycardic with irregular rhythm. No murmur, rubs noted. No JVD or BLE edema. No carotid bruits noted. Pulmonary/Chest: Normal effort and positive vesicular breath sounds. No respiratory distress. No wheezes, rales or ronchi noted.  Musculoskeletal: No Bony tenderness noted over the spine.  Pain with palpation of the right parathoracic  muscles in the subscapular region.  Strength 5/5 BLE.  No difficulty with gait.  Neurological: Alert and oriented. Cranial nerves II-XII grossly intact. Coordination normal.  Psychiatric: Mood and affect normal. Behavior is normal. Judgment and thought content normal.     BMET    Component Value Date/Time   NA 139 02/04/2023 0822   K 4.4 02/04/2023 0822   CL 104 02/04/2023 0822   CO2 25 02/04/2023 0822   GLUCOSE 96 02/04/2023 0822   BUN 14 02/04/2023 0822   CREATININE 0.94 02/04/2023 0822   CALCIUM  9.8 02/04/2023 0822    Lipid Panel     Component Value Date/Time   CHOL 174 05/01/2023 0907   TRIG 151 (H) 05/01/2023 0907   HDL 53 05/01/2023 0907   CHOLHDL 3.3 05/01/2023 0907   CHOLHDL 3.7 02/04/2023 0822   LDLCALC 95 05/01/2023 0907   LDLCALC 119 (H) 02/04/2023 0822    CBC    Component Value Date/Time   WBC 7.9 02/04/2023 0822   RBC 5.66 02/04/2023 0822   HGB 16.5 02/04/2023 0822   HCT 48.4 02/04/2023 0822   PLT 257 02/04/2023 0822   MCV 85.5 02/04/2023 0822   MCH 29.2 02/04/2023 0822   MCHC 34.1 02/04/2023 0822   RDW 12.3 02/04/2023 0822    Hgb A1C Lab Results  Component Value Date   HGBA1C 5.9 (H) 02/04/2023           Assessment & Plan:     RTC in 6 months for your annual exam Angeline Laura, NP

## 2023-08-01 NOTE — Assessment & Plan Note (Signed)
 Encourage weight loss as this can help reduce sleep apnea symptoms Continue pantoprazole  40 mg daily

## 2023-08-03 ENCOUNTER — Ambulatory Visit: Payer: Self-pay | Admitting: Internal Medicine

## 2023-08-05 ENCOUNTER — Telehealth: Payer: Self-pay | Admitting: Internal Medicine

## 2023-08-05 ENCOUNTER — Other Ambulatory Visit: Payer: Self-pay | Admitting: Internal Medicine

## 2023-08-05 DIAGNOSIS — M48061 Spinal stenosis, lumbar region without neurogenic claudication: Secondary | ICD-10-CM

## 2023-08-05 MED ORDER — TRAMADOL HCL 50 MG PO TABS: 50.0000 mg | ORAL_TABLET | Freq: Two times a day (BID) | ORAL | 0 refills | Status: DC | PRN

## 2023-08-05 NOTE — Telephone Encounter (Signed)
 Copied from CRM [id number]. Topic: Clinical - Medication Refill >> Aug 05, 2023 12:16 PM Nathanel BROCKS wrote: Medication: traMADol  (ULTRAM ) 50 MG tablet  Has the patient contacted their pharmacy? Yes  This is the patient's preferred pharmacy:  [id number] - [place],  - [address] [place] [address] Phone: [phone] Fax: [phone]  Is this the correct pharmacy for this prescription? Yes If no, delete pharmacy and type the correct one.   Has the prescription been filled recently? Yes  Is the patient out of the medication? Yes  Has the patient been seen for an appointment in the last year OR does the patient have an upcoming appointment? Yes  Can we respond through MyChart? No  Agent: Please be advised that Rx refills may take up to 3 business days. We ask that you follow-up with your pharmacy.

## 2023-08-05 NOTE — Telephone Encounter (Unsigned)
 Copied from CRM [id number]. Topic: Clinical - Medication Refill >> Aug 05, 2023  9:05 AM Charlet HERO wrote: Medication: traMADol  (ULTRAM ) 50 MG tablet  Has the patient contacted their pharmacy? No Narcotic (Agent: If yes, when and what did the pharmacy advise?)  This is the patient's preferred pharmacy:  [id number] - [place], [place] - [address] [place] [address] Phone: [phone] Fax: [phone]    Is this the correct pharmacy for this prescription? Yes If no, delete pharmacy and type the correct one.   Has the prescription been filled recently? Yes  Is the patient out of the medication? Yes  Has the patient been seen for an appointment in the last year OR does the patient have an upcoming appointment? Yes  Can we respond through MyChart? Yes  Agent: Please be advised that Rx refills may take up to 3 business days. We ask that you follow-up with your pharmacy.

## 2023-08-06 ENCOUNTER — Ambulatory Visit: Payer: Self-pay | Admitting: Internal Medicine

## 2023-08-25 ENCOUNTER — Other Ambulatory Visit: Payer: Self-pay | Admitting: Cardiology

## 2023-08-25 DIAGNOSIS — I4891 Unspecified atrial fibrillation: Secondary | ICD-10-CM

## 2023-08-27 NOTE — Telephone Encounter (Signed)
 Prescription refill request for Eliquis  received. Indication: Afib   Last office visit: 05/01/23 ([facility]) Scr: 0.91 (08/01/23)  Age: 58 Weight: 88.5kg  Appropriate dose. Refill sent.

## 2023-08-29 ENCOUNTER — Other Ambulatory Visit: Payer: Self-pay | Admitting: Internal Medicine

## 2023-08-29 NOTE — Telephone Encounter (Signed)
 Requested medication (s) are due for refill today: yes  Requested medication (s) are on the active medication list: yes  Last refill:  03/26/23  Future visit scheduled: yes  Notes to clinic:   Medication not assigned to a protocol, review manually.     Requested Prescriptions  Pending Prescriptions Disp Refills   TRELEGY ELLIPTA  100-62.5-25 MCG/ACT AEPB [Pharmacy Med Name: TRELEGY ELLIPTA  100-62.5-25] 60 each 4    Sig: INHALE 1 PUFF BY MOUTH EVERY DAY     Off-Protocol Failed - 08/29/2023  4:00 PM      Failed - Medication not assigned to a protocol, review manually.      Passed - Valid encounter within last 12 months    Recent Outpatient Visits           4 weeks ago Mixed hyperlipidemia   [place] [facility] [place], Angeline ORN, [place]

## 2023-09-05 ENCOUNTER — Other Ambulatory Visit: Payer: Self-pay | Admitting: Family Medicine

## 2023-09-05 DIAGNOSIS — M48061 Spinal stenosis, lumbar region without neurogenic claudication: Secondary | ICD-10-CM

## 2023-09-07 ENCOUNTER — Encounter: Payer: Self-pay | Admitting: Family Medicine

## 2023-09-07 NOTE — Telephone Encounter (Signed)
 Requested medication (s) are due for refill today: Yes  Requested medication (s) are on the active medication list: Yes  Last refill:  08/05/23  Future visit scheduled: Yes  Notes to clinic:  Unable to refill per protocol, cannot delegate.      Requested Prescriptions  Pending Prescriptions Disp Refills   traMADol  (ULTRAM ) 50 MG tablet [Pharmacy Med Name: TRAMADOL  HCL 50 MG TABLET] 60 tablet 0    Sig: TAKE 1 TABLET BY MOUTH 2 TIMES DAILY AS NEEDED.     Not Delegated - Analgesics:  Opioid Agonists Failed - 09/07/2023  9:53 PM      Failed - This refill cannot be delegated      Failed - Urine Drug Screen completed in last 360 days      Passed - Valid encounter within last 3 months    Recent Outpatient Visits           1 month ago Mixed hyperlipidemia   [facility] [facility] [place], Angeline ORN, [place]

## 2023-09-16 ENCOUNTER — Other Ambulatory Visit: Payer: Self-pay | Admitting: Cardiology

## 2023-10-09 ENCOUNTER — Other Ambulatory Visit: Payer: Self-pay | Admitting: Internal Medicine

## 2023-10-09 DIAGNOSIS — M48061 Spinal stenosis, lumbar region without neurogenic claudication: Secondary | ICD-10-CM

## 2023-10-10 NOTE — Telephone Encounter (Signed)
 Requested medication (s) are due for refill today: yes  Requested medication (s) are on the active medication list: yes  Last refill:  09/09/23  Future visit scheduled: {Yes  Notes to clinic:  Unable to refill per protocol, cannot delegate.      Requested Prescriptions  Pending Prescriptions Disp Refills   traMADol  (ULTRAM ) 50 MG tablet [Pharmacy Med Name: TRAMADOL  HCL 50 MG TABLET] 60 tablet 0    Sig: TAKE 1 TABLET BY MOUTH 2 TIMES DAILY AS NEEDED.     Not Delegated - Analgesics:  Opioid Agonists Failed - 10/10/2023  2:54 PM      Failed - This refill cannot be delegated      Failed - Urine Drug Screen completed in last 360 days      Passed - Valid encounter within last 3 months    Recent Outpatient Visits           2 months ago Mixed hyperlipidemia   [place] [facility] [place], Angeline ORN, [place]

## 2023-11-08 ENCOUNTER — Other Ambulatory Visit: Payer: Self-pay | Admitting: Internal Medicine

## 2023-11-08 DIAGNOSIS — M48061 Spinal stenosis, lumbar region without neurogenic claudication: Secondary | ICD-10-CM

## 2023-11-12 NOTE — Telephone Encounter (Signed)
 Requested medications are due for refill today.  yes  Requested medications are on the active medications list.  yes  Last refill. 10/10/2023 #60 0 rf  Future visit scheduled.   yes  Notes to clinic.  Refill not delegated.    Requested Prescriptions  Pending Prescriptions Disp Refills   traMADol  (ULTRAM ) 50 MG tablet [Pharmacy Med Name: TRAMADOL  HCL 50 MG TABLET] 60 tablet 0    Sig: TAKE 1 TABLET BY MOUTH 2 TIMES DAILY AS NEEDED.     Not Delegated - Analgesics:  Opioid Agonists Failed - 11/12/2023 11:52 AM      Failed - This refill cannot be delegated      Failed - Urine Drug Screen completed in last 360 days      Failed - Valid encounter within last 3 months    Recent Outpatient Visits           3 months ago Mixed hyperlipidemia   [place] [facility] [place], Jeremy Turner, Jeremy Turner

## 2023-11-16 ENCOUNTER — Other Ambulatory Visit: Payer: Self-pay | Admitting: Cardiology

## 2023-12-13 ENCOUNTER — Other Ambulatory Visit: Payer: Self-pay | Admitting: Internal Medicine

## 2023-12-13 DIAGNOSIS — M48061 Spinal stenosis, lumbar region without neurogenic claudication: Secondary | ICD-10-CM

## 2023-12-15 NOTE — Telephone Encounter (Signed)
 Requested medications are due for refill today.  yes  Requested medications are on the active medications list.  yes  Last refill. 11/12/2023 #60 0 rf  Future visit scheduled.   yes  Notes to clinic.  Refill not delegated.    Requested Prescriptions  Pending Prescriptions Disp Refills   traMADol  (ULTRAM ) 50 MG tablet [Pharmacy Med Name: TRAMADOL  HCL 50 MG TABLET] 60 tablet 0    Sig: TAKE 1 TABLET BY MOUTH 2 TIMES DAILY AS NEEDED.     Not Delegated - Analgesics:  Opioid Agonists Failed - 12/15/2023  2:16 PM      Failed - This refill cannot be delegated      Failed - Urine Drug Screen completed in last 360 days      Failed - Valid encounter within last 3 months    Recent Outpatient Visits           4 months ago Mixed hyperlipidemia   [place] [facility] [place], Angeline ORN, [place]

## 2024-01-02 ENCOUNTER — Encounter: Payer: Self-pay | Admitting: Cardiology

## 2024-01-02 ENCOUNTER — Ambulatory Visit: Attending: Cardiology | Admitting: Cardiology

## 2024-01-02 VITALS — BP 136/82 | HR 74 | Ht 69.0 in | Wt 192.0 lb

## 2024-01-02 DIAGNOSIS — I2510 Atherosclerotic heart disease of native coronary artery without angina pectoris: Secondary | ICD-10-CM | POA: Diagnosis not present

## 2024-01-02 DIAGNOSIS — I480 Paroxysmal atrial fibrillation: Secondary | ICD-10-CM | POA: Diagnosis not present

## 2024-01-02 DIAGNOSIS — E782 Mixed hyperlipidemia: Secondary | ICD-10-CM

## 2024-01-02 NOTE — Progress Notes (Signed)
 Cardiology Office Note:    Date:  01/02/2024   ID:  Jeremy Turner, DOB [birth year], MRN [id number]  PCP:  Antonette Angeline ORN, NP   [facility] Providers Cardiologist:  Redell Cave, MD     Referring MD: Antonette Angeline ORN, NP   Chief Complaint  Patient presents with   Follow-up    6 month follow up visit. Patient is doing well . Meds reviewed.     History of Present Illness:    Jeremy Turner is a 58 y.o. male with a hx of nonobstructive CAD (LHC-03/2019-30% left circumflex disease ), paroxysmal atrial fibrillation, hyperlipidemia, smoker x40+ years who presents for follow-up.    Feels well, doing okay, denies chest pain or shortness of breath.  Compliant with medications as prescribed.  Denies any bleeding issues with taking Eliquis .  States having rare palpitations lasting a few seconds otherwise has no other cardiac concerns.  Prior notes Echo 12/2021 EF 50 to 55% -echocardiogram 08/2020 EF 55% apparently had a stress test back in 2021 which was abnormal,  left heart cath 03/2019 showing 30% left circumflex disease.   Past Medical History:  Diagnosis Date   Aortic atherosclerosis    Arrhythmia    Bilateral inguinal hernia    CAD (coronary artery disease) 03/2019   a.) LHC (done in [place] 30% LCx disease - med mgmt.   Cardiac murmur    COPD (chronic obstructive pulmonary disease) (HCC)    DDD (degenerative disc disease), lumbar    HLD (hyperlipidemia)    Long term current use of anticoagulant    a.) apixaban    Marijuana use    a.) UDS (+) for [facility] 05/2021   PAF (paroxysmal atrial fibrillation) (HCC)    a.) CHA2DS2VASc = 1 (vascular disease history);  b.) rate/rhythm maintained on oral metoprolol  succinate; chronically anticoagulated with apixaban    Stenosis of lateral recess of lumbar spine    a.) s/p laminectomy with L3-L5 posterior and interbody fusion   Uncomplicated opioid use    a.) long term Tramadol  Rx'd by PCP; signed OUA on file    Past  Surgical History:  Procedure Laterality Date   CARDIAC CATHETERIZATION  03/2019   COLONOSCOPY WITH PROPOFOL  N/A 04/11/2022   Procedure: COLONOSCOPY WITH PROPOFOL ;  Surgeon: Unk Corinn Skiff, MD;  Location: [facility] ENDOSCOPY;  Service: Gastroenterology;  Laterality: N/A;   Herina repair  10/2021   PATELLA FRACTURE SURGERY Left    1990's   POSTERIOR LUMBAR FUSION  09/2020   Procedure: LAMINECTOMY;  L3-L5 POSTERIOR AND INTERBODY FUSION    Current Medications: Current Meds  Medication Sig   acetaminophen  (TYLENOL ) 500 MG tablet Take 2 tablets (1,000 mg total) by mouth every 6 (six) hours as needed for mild pain.   albuterol (PROVENTIL) (2.5 MG/3ML) 0.083% nebulizer solution SMARTSIG:1 Vial(s) Via Nebulizer 4 Times Daily PRN   albuterol (VENTOLIN HFA) 108 (90 Base) MCG/ACT inhaler SMARTSIG:2 Puff(s) Via Inhaler 3 Times Daily PRN   apixaban  (ELIQUIS ) 5 MG TABS tablet TAKE 1 TABLET BY MOUTH TWICE A DAY   atorvastatin  (LIPITOR) 80 MG tablet TAKE 1 TABLET BY MOUTH EVERY DAY   diclofenac Sodium (VOLTAREN) 1 % GEL Apply 2 g topically 4 (four) times daily.   metoprolol  succinate (TOPROL -XL) 25 MG 24 hr tablet TAKE 1 TABLET BY MOUTH TWICE A DAY   mupirocin  cream (BACTROBAN ) 2 % Apply 1 Application topically 2 (two) times daily.   pantoprazole  (PROTONIX ) 40 MG tablet TAKE 1 TABLET BY MOUTH EVERY DAY   traMADol  (ULTRAM )  50 MG tablet Take 1 tablet (50 mg total) by mouth 2 (two) times daily as needed.   TRELEGY ELLIPTA  100-62.5-25 MCG/ACT AEPB INHALE 1 PUFF BY MOUTH EVERY DAY     Allergies:   Cefadroxil   Social History   Socioeconomic History   Marital status: Significant Other    Spouse name: Inocente   Number of children: Not on file   Years of education: Not on file   Highest education level: Associate degree: occupational, scientist, product/process development, or vocational program  Occupational History   Not on file  Tobacco Use   Smoking status: Every Day    Current packs/day: 0.10    Types: Cigarettes    Passive  exposure: Past   Smokeless tobacco: Never   Tobacco comments:    3-4 cigarettes per day current 11-02-21  Vaping Use   Vaping status: Never Used  Substance and Sexual Activity   Alcohol use: Not Currently   Drug use: Not Currently    Types: Marijuana    Comment: daily   Sexual activity: Not on file  Other Topics Concern   Not on file  Social History Narrative   Not on file   Social Drivers of Health   Financial Resource Strain: Medium Risk (08/01/2023)   Overall Financial Resource Strain (CARDIA)    Difficulty of Paying Living Expenses: Somewhat hard  Food Insecurity: No Food Insecurity (08/01/2023)   Hunger Vital Sign    Worried About Running Out of Food in the Last Year: Never true    Ran Out of Food in the Last Year: Never true  Transportation Needs: No Transportation Needs (08/01/2023)   PRAPARE - Administrator, Civil Service (Medical): No    Lack of Transportation (Non-Medical): No  Physical Activity: Insufficiently Active (08/01/2023)   Exercise Vital Sign    Days of Exercise per Week: 7 days    Minutes of Exercise per Session: 20 min  Stress: No Stress Concern Present (08/01/2023)   [employer] of Occupational Health - Occupational Stress Questionnaire    Feeling of Stress: Not at all  Social Connections: Moderately Isolated (08/01/2023)   Social Connection and Isolation Panel    Frequency of Communication with Friends and Family: More than three times a week    Frequency of Social Gatherings with Friends and Family: More than three times a week    Attends Religious Services: Never    Database Administrator or Organizations: No    Attends Engineer, Structural: Not on file    Marital Status: Living with partner     Family History: The patient's family history includes Healthy in his brother and sister; Heart disease in his father. There is no history of Colon cancer or Prostate cancer.  ROS:   Please see the history of present illness.     All  other systems reviewed and are negative.  EKGs/Labs/Other Studies Reviewed:    The following studies were reviewed today:   EKG Interpretation Date/Time:  Thursday January 02 2024 11:17:16 EST Ventricular Rate:  74 PR Interval:  162 QRS Duration:  104 QT Interval:  372 QTC Calculation: 412 R Axis:   -17  Text Interpretation: Normal sinus rhythm Normal ECG Confirmed by Darliss Rogue ([id number]) on 01/02/2024 11:43:35 AM    Recent Labs: 08/01/2023: ALT 44; BUN 13; Creat 0.91; Hemoglobin 14.9; Platelets 212; Potassium 4.3; Sodium 140  Recent Lipid Panel    Component Value Date/Time   CHOL 174 05/01/2023 0907   TRIG  151 (H) 05/01/2023 0907   HDL 53 05/01/2023 0907   CHOLHDL 3.3 05/01/2023 0907   CHOLHDL 3.7 02/04/2023 0822   LDLCALC 95 05/01/2023 0907   LDLCALC 119 (H) 02/04/2023 0822     Risk Assessment/Calculations:             Physical Exam:    VS:  BP 136/82   Pulse 74   Ht 5' 9 (1.753 m)   Wt 192 lb (87.1 kg)   SpO2 98%   BMI 28.35 kg/m     Wt Readings from Last 3 Encounters:  01/02/24 192 lb (87.1 kg)  08/01/23 195 lb 3.2 oz (88.5 kg)  05/01/23 197 lb 3.2 oz (89.4 kg)     GEN:  Well nourished, well developed in no acute distress HEENT: Normal NECK: No JVD; No carotid bruits CARDIAC: RRR, no murmurs, rubs, gallops RESPIRATORY:  Clear to auscultation without rales, wheezing or rhonchi  ABDOMEN: Soft, non-tender, non-distended MUSCULOSKELETAL:  No edema; No deformity  SKIN: Warm and dry NEUROLOGIC:  Alert and oriented x 3 PSYCHIATRIC:  Normal affect   ASSESSMENT:    1. Coronary artery disease involving native coronary artery of native heart without angina pectoris   2. Paroxysmal atrial fibrillation (HCC)   3. Mixed hyperlipidemia    PLAN:    In order of problems listed above:  Nonobstructive CAD, 30% left circumflex disease.  EF 50 to 55%.  Continue Eliquis  5 mg twice daily, Lipitor 40 mg daily. Paroxysmal atrial fibrillation, currently in  sinus.  Very rare, infrequent palpitations.  Continue Toprol -XL 25 mg daily, Eliquis  5 mg twice daily. Hyperlipidemia, continue Lipitor 80 mg daily.    Follow-up in 12 months.    Medication Adjustments/Labs and Tests Ordered: Current medicines are reviewed at length with the patient today.  Concerns regarding medicines are outlined above.  Orders Placed This Encounter  Procedures   EKG 12-Lead   No orders of the defined types were placed in this encounter.   Patient Instructions  Medication Instructions:  Your physician recommends that you continue on your current medications as directed. Please refer to the Current Medication list given to you today.   *If you need a refill on your cardiac medications before your next appointment, please call your pharmacy*  Lab Work: No labs ordered today  If you have labs (blood work) drawn today and your tests are completely normal, you will receive your results only by: MyChart Message (if you have MyChart) OR A paper copy in the mail If you have any lab test that is abnormal or we need to change your treatment, we will call you to review the results.  Testing/Procedures: No test ordered today   Follow-Up: At [facility], you and your health needs are our priority.  As part of our continuing mission to provide you with exceptional heart care, our providers are all part of one team.  This team includes your primary Cardiologist (physician) and Advanced Practice Providers or APPs (Physician Assistants and Nurse Practitioners) who all work together to provide you with the care you need, when you need it.  Your next appointment:   1 year(s)  Provider:   You may see Redell Cave, MD or one of the following Advanced Practice Providers on your designated Care Team:   Lonni Meager, NP Lesley Maffucci, PA-C Bernardino Bring, PA-C Cadence [place], PA-C Tylene Lunch, NP Barnie Hila, NP    We recommend signing up for the patient  portal called MyChart.  Sign  up information is provided on this After Visit Summary.  MyChart is used to connect with patients for Virtual Visits (Telemedicine).  Patients are able to view lab/test results, encounter notes, upcoming appointments, etc.  Non-urgent messages can be sent to your provider as well.   To learn more about what you can do with MyChart, go to [email or web address].             Signed, Redell Cave, MD  01/02/2024 12:33 PM    [facility]

## 2024-01-02 NOTE — Patient Instructions (Signed)

## 2024-01-14 ENCOUNTER — Other Ambulatory Visit: Payer: Self-pay | Admitting: Internal Medicine

## 2024-01-14 DIAGNOSIS — M48061 Spinal stenosis, lumbar region without neurogenic claudication: Secondary | ICD-10-CM

## 2024-01-16 NOTE — Telephone Encounter (Signed)
Pt called to report that he is almost out of his current supply, please advise

## 2024-01-16 NOTE — Telephone Encounter (Signed)
 Requested medication (s) are due for refill today: Yes  Requested medication (s) are on the active medication list: Yes  Last refill:  12/16/23  Future visit scheduled: Yes  Notes to clinic:  Unable to refill per protocol, cannot delegate.      Requested Prescriptions  Pending Prescriptions Disp Refills   traMADol  (ULTRAM ) 50 MG tablet [Pharmacy Med Name: TRAMADOL  HCL 50 MG TABLET] 60 tablet 0    Sig: TAKE 1 TABLET BY MOUTH 2 TIMES DAILY AS NEEDED.     Not Delegated - Analgesics:  Opioid Agonists Failed - 01/16/2024  5:19 PM      Failed - This refill cannot be delegated      Failed - Urine Drug Screen completed in last 360 days      Failed - Valid encounter within last 3 months    Recent Outpatient Visits           5 months ago Mixed hyperlipidemia   [place] [facility] [place], Angeline ORN, [place]

## 2024-01-28 ENCOUNTER — Other Ambulatory Visit: Payer: Self-pay | Admitting: Internal Medicine

## 2024-01-29 NOTE — Telephone Encounter (Signed)
 Requested medication (s) are due for refill today: Yes  Requested medication (s) are on the active medication list: Yes  Last refill:  methocarbamol  08/01/23; trelegy 08/29/23  Future visit scheduled: Yes  Notes to clinic:  Unable to refill per protocol, cannot delegate methocarbamol . Unable to refill due to no refill protocol for this medication trelegy.     Requested Prescriptions  Pending Prescriptions Disp Refills   methocarbamol  (ROBAXIN ) 500 MG tablet [Pharmacy Med Name: METHOCARBAMOL  500 MG TABLET] 90 tablet 1    Sig: Take 1 tablet (500 mg total) by mouth at bedtime as needed.     Not Delegated - Analgesics:  Muscle Relaxants Failed - 01/29/2024 11:06 AM      Failed - This refill cannot be delegated      Failed - Valid encounter within last 6 months    Recent Outpatient Visits           6 months ago Mixed hyperlipidemia   [facility] [place], [place], NP               TRELEGY ELLIPTA  100-62.5-25 MCG/ACT AEPB [Pharmacy Med Name: TRELEGY ELLIPTA  100-62.5-25] 60 each 4    Sig: INHALE 1 PUFF BY MOUTH EVERY DAY     Off-Protocol Failed - 01/29/2024 11:06 AM      Failed - Medication not assigned to a protocol, review manually.      Passed - Valid encounter within last 12 months    Recent Outpatient Visits           6 months ago Mixed hyperlipidemia    [facility] [place], Angeline ORN, [place]

## 2024-02-07 ENCOUNTER — Ambulatory Visit: Admitting: Internal Medicine

## 2024-02-07 VITALS — BP 130/78 | Ht 69.0 in | Wt 193.0 lb

## 2024-02-07 DIAGNOSIS — Z23 Encounter for immunization: Secondary | ICD-10-CM | POA: Diagnosis not present

## 2024-02-07 DIAGNOSIS — Z6828 Body mass index (BMI) 28.0-28.9, adult: Secondary | ICD-10-CM

## 2024-02-07 DIAGNOSIS — E782 Mixed hyperlipidemia: Secondary | ICD-10-CM

## 2024-02-07 DIAGNOSIS — Z125 Encounter for screening for malignant neoplasm of prostate: Secondary | ICD-10-CM | POA: Diagnosis not present

## 2024-02-07 DIAGNOSIS — R7303 Prediabetes: Secondary | ICD-10-CM | POA: Diagnosis not present

## 2024-02-07 DIAGNOSIS — Z0001 Encounter for general adult medical examination with abnormal findings: Secondary | ICD-10-CM | POA: Diagnosis not present

## 2024-02-07 DIAGNOSIS — E663 Overweight: Secondary | ICD-10-CM

## 2024-02-07 NOTE — Patient Instructions (Signed)
 Health Maintenance, Male  Adopting a healthy lifestyle and getting preventive care are important in promoting health and wellness. Ask your health care provider about:  The right schedule for you to have regular tests and exams.  Things you can do on your own to prevent diseases and keep yourself healthy.  What should I know about diet, weight, and exercise?  Eat a healthy diet    Eat a diet that includes plenty of vegetables, fruits, low-fat dairy products, and lean protein.  Do not eat a lot of foods that are high in solid fats, added sugars, or sodium.  Maintain a healthy weight  Body mass index (BMI) is a measurement that can be used to identify possible weight problems. It estimates body fat based on height and weight. Your health care provider can help determine your BMI and help you achieve or maintain a healthy weight.  Get regular exercise  Get regular exercise. This is one of the most important things you can do for your health. Most adults should:  Exercise for at least 150 minutes each week. The exercise should increase your heart rate and make you sweat (moderate-intensity exercise).  Do strengthening exercises at least twice a week. This is in addition to the moderate-intensity exercise.  Spend less time sitting. Even light physical activity can be beneficial.  Watch cholesterol and blood lipids  Have your blood tested for lipids and cholesterol at 59 years of age, then have this test every 5 years.  You may need to have your cholesterol levels checked more often if:  Your lipid or cholesterol levels are high.  You are older than 59 years of age.  You are at high risk for heart disease.  What should I know about cancer screening?  Many types of cancers can be detected early and may often be prevented. Depending on your health history and family history, you may need to have cancer screening at various ages. This may include screening for:  Colorectal cancer.  Prostate cancer.  Skin cancer.  Lung  cancer.  What should I know about heart disease, diabetes, and high blood pressure?  Blood pressure and heart disease  High blood pressure causes heart disease and increases the risk of stroke. This is more likely to develop in people who have high blood pressure readings or are overweight.  Talk with your health care provider about your target blood pressure readings.  Have your blood pressure checked:  Every 3-5 years if you are 24-52 years of age.  Every year if you are 3 years old or older.  If you are between the ages of 60 and 72 and are a current or former smoker, ask your health care provider if you should have a one-time screening for abdominal aortic aneurysm (AAA).  Diabetes  Have regular diabetes screenings. This checks your fasting blood sugar level. Have the screening done:  Once every three years after age 66 if you are at a normal weight and have a low risk for diabetes.  More often and at a younger age if you are overweight or have a high risk for diabetes.  What should I know about preventing infection?  Hepatitis B  If you have a higher risk for hepatitis B, you should be screened for this virus. Talk with your health care provider to find out if you are at risk for hepatitis B infection.  Hepatitis C  Blood testing is recommended for:  Everyone born from [birth year] through 1965.  Anyone  with known risk factors for hepatitis C.  Sexually transmitted infections (STIs)  You should be screened each year for STIs, including gonorrhea and chlamydia, if:  You are sexually active and are younger than 59 years of age.  You are older than 59 years of age and your health care provider tells you that you are at risk for this type of infection.  Your sexual activity has changed since you were last screened, and you are at increased risk for chlamydia or gonorrhea. Ask your health care provider if you are at risk.  Ask your health care provider about whether you are at high risk for HIV. Your health care provider  may recommend a prescription medicine to help prevent HIV infection. If you choose to take medicine to prevent HIV, you should first get tested for HIV. You should then be tested every 3 months for as long as you are taking the medicine.  Follow these instructions at home:  Alcohol use  Do not drink alcohol if your health care provider tells you not to drink.  If you drink alcohol:  Limit how much you have to 0-2 drinks a day.  Know how much alcohol is in your drink. In the [place] one drink equals one 12 oz bottle of beer (355 mL), one 5 oz glass of wine (148 mL), or one 1 oz glass of hard liquor (44 mL).  Lifestyle  Do not use any products that contain nicotine or tobacco. These products include cigarettes, chewing tobacco, and vaping devices, such as e-cigarettes. If you need help quitting, ask your health care provider.  Do not use street drugs.  Do not share needles.  Ask your health care provider for help if you need support or information about quitting drugs.  General instructions  Schedule regular health, dental, and eye exams.  Stay current with your vaccines.  Tell your health care provider if:  You often feel depressed.  You have ever been abused or do not feel safe at home.  Summary  Adopting a healthy lifestyle and getting preventive care are important in promoting health and wellness.  Follow your health care provider's instructions about healthy diet, exercising, and getting tested or screened for diseases.  Follow your health care provider's instructions on monitoring your cholesterol and blood pressure.  This information is not intended to replace advice given to you by your health care provider. Make sure you discuss any questions you have with your health care provider.  Document Revised: 06/06/2020 Document Reviewed: 06/06/2020  Elsevier Patient Education  2024 [employer].

## 2024-02-07 NOTE — Assessment & Plan Note (Signed)
 Encouraged diet and exercise for weight loss ?

## 2024-02-07 NOTE — Progress Notes (Signed)
 "  Subjective:    Patient ID: Jeremy Turner, male    DOB: 04-29-1965, 59 y.o.   MRN: [id number]  HPI  Patient presents to clinic today for his annual exam.  Flu: never Tetanus: unsure COVID: never Prevnar 20: never Shingrix: never PSA screening: 01/2023 Colon screening: 03/2022 Lung cancer screening: Never Vision screening: as needed Dentist: as needed  Diet: He does eat meat. He consumes fruits and veggies. He tries to avoid fried foods. He drinks mostly coffee, some water . Exercise: Walking, stretching  Review of Systems     Past Medical History:  Diagnosis Date   Aortic atherosclerosis    Arrhythmia    Bilateral inguinal hernia    CAD (coronary artery disease) 03/2019   a.) LHC (done in [place]): 30% LCx disease - med mgmt.   Cardiac murmur    COPD (chronic obstructive pulmonary disease) (HCC)    DDD (degenerative disc disease), lumbar    HLD (hyperlipidemia)    Long term current use of anticoagulant    a.) apixaban    Marijuana use    a.) UDS (+) for [facility] 05/2021   PAF (paroxysmal atrial fibrillation) (HCC)    a.) CHA2DS2VASc = 1 (vascular disease history);  b.) rate/rhythm maintained on oral metoprolol  succinate; chronically anticoagulated with apixaban    Stenosis of lateral recess of lumbar spine    a.) s/p laminectomy with L3-L5 posterior and interbody fusion   Uncomplicated opioid use    a.) long term Tramadol  Rx'd by PCP; signed OUA on file    Current Outpatient Medications  Medication Sig Dispense Refill   acetaminophen  (TYLENOL ) 500 MG tablet Take 2 tablets (1,000 mg total) by mouth every 6 (six) hours as needed for mild pain.     albuterol (PROVENTIL) (2.5 MG/3ML) 0.083% nebulizer solution SMARTSIG:1 Vial(s) Via Nebulizer 4 Times Daily PRN     albuterol (VENTOLIN HFA) 108 (90 Base) MCG/ACT inhaler SMARTSIG:2 Puff(s) Via Inhaler 3 Times Daily PRN     apixaban  (ELIQUIS ) 5 MG TABS tablet TAKE 1 TABLET BY MOUTH TWICE A DAY 180 tablet 1   atorvastatin  (LIPITOR)  80 MG tablet TAKE 1 TABLET BY MOUTH EVERY DAY 90 tablet 2   diclofenac Sodium (VOLTAREN) 1 % GEL Apply 2 g topically 4 (four) times daily.     methocarbamol  (ROBAXIN ) 500 MG tablet TAKE 1 TABLET (500 MG TOTAL) BY MOUTH AT BEDTIME AS NEEDED. 90 tablet 0   metoprolol  succinate (TOPROL -XL) 25 MG 24 hr tablet TAKE 1 TABLET BY MOUTH TWICE A DAY 180 tablet 2   mupirocin  cream (BACTROBAN ) 2 % Apply 1 Application topically 2 (two) times daily. 15 g 0   pantoprazole  (PROTONIX ) 40 MG tablet TAKE 1 TABLET BY MOUTH EVERY DAY 90 tablet 2   traMADol  (ULTRAM ) 50 MG tablet Take 1 tablet (50 mg total) by mouth 2 (two) times daily as needed. 60 tablet 0   TRELEGY ELLIPTA  100-62.5-25 MCG/ACT AEPB INHALE 1 PUFF BY MOUTH EVERY DAY 60 each 4   No current facility-administered medications for this visit.    Allergies  Allergen Reactions   Cefadroxil Hives and Palpitations    Family History  Problem Relation Age of Onset   Heart disease Father    Healthy Sister    Healthy Brother    Colon cancer Neg Hx    Prostate cancer Neg Hx     Social History   Socioeconomic History   Marital status: Significant Other    Spouse name: Inocente   Number of children: Not  on file   Years of education: Not on file   Highest education level: Professional school degree (e.g., MD, DDS, DVM, JD)  Occupational History   Not on file  Tobacco Use   Smoking status: Every Day    Current packs/day: 0.10    Types: Cigarettes    Passive exposure: Past   Smokeless tobacco: Never   Tobacco comments:    3-4 cigarettes per day current 11-02-21  Vaping Use   Vaping status: Never Used  Substance and Sexual Activity   Alcohol use: Not Currently   Drug use: Not Currently    Types: Marijuana    Comment: daily   Sexual activity: Not on file  Other Topics Concern   Not on file  Social History Narrative   Not on file   Social Drivers of Health   Tobacco Use: High Risk (01/02/2024)   Patient History    Smoking Tobacco Use:  Every Day    Smokeless Tobacco Use: Never    Passive Exposure: Past  Financial Resource Strain: Medium Risk (02/07/2024)   Overall Financial Resource Strain (CARDIA)    Difficulty of Paying Living Expenses: Somewhat hard  Food Insecurity: No Food Insecurity (02/07/2024)   Epic    Worried About Programme Researcher, Broadcasting/film/video in the Last Year: Never true    Ran Out of Food in the Last Year: Never true  Transportation Needs: No Transportation Needs (02/07/2024)   Epic    Lack of Transportation (Medical): No    Lack of Transportation (Non-Medical): No  Physical Activity: Sufficiently Active (02/07/2024)   Exercise Vital Sign    Days of Exercise per Week: 7 days    Minutes of Exercise per Session: 150+ min  Stress: No Stress Concern Present (02/07/2024)   [employer] of Occupational Health - Occupational Stress Questionnaire    Feeling of Stress: Not at all  Social Connections: Moderately Isolated (02/07/2024)   Social Connection and Isolation Panel    Frequency of Communication with Friends and Family: More than three times a week    Frequency of Social Gatherings with Friends and Family: More than three times a week    Attends Religious Services: Never    Database Administrator or Organizations: No    Attends Engineer, Structural: Not on file    Marital Status: Living with partner  Intimate Partner Violence: Not At Risk (06/07/2023)   Humiliation, Afraid, Rape, and Kick questionnaire    Fear of Current or Ex-Partner: No    Emotionally Abused: No    Physically Abused: No    Sexually Abused: No  Depression (PHQ2-9): Low Risk (08/01/2023)   Depression (PHQ2-9)    PHQ-2 Score: 1  Alcohol Screen: Low Risk (02/07/2024)   Alcohol Screen    Last Alcohol Screening Score (AUDIT): 1  Housing: Unknown (02/07/2024)   Epic    Unable to Pay for Housing in the Last Year: No    Number of Times Moved in the Last Year: Not on file    Homeless in the Last Year: No  Utilities: Not At Risk (06/07/2023)   [facility]  Utilities    Threatened with loss of utilities: No  Health Literacy: Adequate Health Literacy (06/07/2023)   B1300 Health Literacy    Frequency of need for help with medical instructions: Never     Constitutional: Denies fever, malaise, fatigue, headache or abrupt weight changes.  HEENT: Denies eye pain, eye redness, ear pain, ringing in the ears, wax buildup, runny nose, nasal  congestion, bloody nose, or sore throat. Respiratory: Denies difficulty breathing, shortness of breath, cough or sputum production.   Cardiovascular: Denies chest pain, chest tightness, palpitations or swelling in the hands or feet.  Gastrointestinal: Denies abdominal pain, bloating, constipation, diarrhea or blood in the stool.  GU: Denies urgency, frequency, pain with urination, burning sensation, blood in urine, odor or discharge. Musculoskeletal: Patient reports chronic low back pain.  Denies decrease in range of motion, difficulty with gait, muscle pain or joint swelling.  Skin: Denies redness, rashes, lesions or ulcercations.  Neurological: Pt reports paresthesia of feet. Denies dizziness, difficulty with memory, difficulty with speech or problems with balance and coordination.  Psych: Denies anxiety, depression, SI/HI.  No other specific complaints in a complete review of systems (except as listed in HPI above).  Objective:   Physical Exam  BP 130/78 (BP Location: Left Arm, Patient Position: Sitting, Cuff Size: Normal)   Ht 5' 9 (1.753 m)   Wt 193 lb (87.5 kg)   BMI 28.50 kg/m     Wt Readings from Last 3 Encounters:  01/02/24 192 lb (87.1 kg)  08/01/23 195 lb 3.2 oz (88.5 kg)  05/01/23 197 lb 3.2 oz (89.4 kg)    General: Appears his stated age, overweight, in NAD. Skin: Warm, dry and intact.   HEENT: Head: normal shape and size; Eyes: sclera white, no icterus, conjunctiva pink, PERRLA and EOMs intact;  Neck:  Neck supple, trachea midline. Cardiovascular: Bradycardic with normal rhythm. S1,S2  noted.  No murmur, rubs or gallops noted. No JVD or BLE edema. No carotid bruits noted. Pulmonary/Chest: Normal effort and positive vesicular breath sounds. No respiratory distress. No wheezes, rales or ronchi noted.  Abdomen: Normal bowel sounds.  Musculoskeletal: No pain with palpation over lumbar spine.  Strength 5/5 BUE/BLE.  No difficulty with gait.  Neurological: Alert and oriented. Cranial nerves II-XII grossly intact. Coordination normal.  Psychiatric: Mood and affect normal. Behavior is normal. Judgment and thought content normal.   BMET    Component Value Date/Time   NA 140 08/01/2023 0851   K 4.3 08/01/2023 0851   CL 107 08/01/2023 0851   CO2 25 08/01/2023 0851   GLUCOSE 90 08/01/2023 0851   BUN 13 08/01/2023 0851   CREATININE 0.91 08/01/2023 0851   CALCIUM  9.2 08/01/2023 0851    Lipid Panel     Component Value Date/Time   CHOL 174 05/01/2023 0907   TRIG 151 (H) 05/01/2023 0907   HDL 53 05/01/2023 0907   CHOLHDL 3.3 05/01/2023 0907   CHOLHDL 3.7 02/04/2023 0822   LDLCALC 95 05/01/2023 0907   LDLCALC 119 (H) 02/04/2023 0822    CBC    Component Value Date/Time   WBC 6.3 08/01/2023 0851   RBC 5.13 08/01/2023 0851   HGB 14.9 08/01/2023 0851   HCT 45.7 08/01/2023 0851   PLT 212 08/01/2023 0851   MCV 89.1 08/01/2023 0851   MCH 29.0 08/01/2023 0851   MCHC 32.6 08/01/2023 0851   RDW 12.8 08/01/2023 0851    Hgb A1C Lab Results  Component Value Date   HGBA1C 5.9 (H) 08/01/2023            Assessment & Plan:   Preventative Health Maintenance:  He declines flu shot Tdap today Encouraged him to get his COVID-vaccine Prevnar 20 declined Discussed Shingrix vaccine, he will check coverage with his insurance company and schedule a visit at the pharmacy if he would like to have this done Colon screening UTD Lung cancer screening declined  Encouraged him to consume a balanced diet and exercise regimen Advised him to see an eye doctor and dentist  annually Will check CBC, c-Met, lipid, A1c and PSA today   RTC in 6 months, follow-up chronic conditions Angeline Laura, NP  "

## 2024-02-08 LAB — LIPID PANEL
Cholesterol: 159 mg/dL
HDL: 57 mg/dL
LDL Cholesterol (Calc): 82 mg/dL
Non-HDL Cholesterol (Calc): 102 mg/dL
Total CHOL/HDL Ratio: 2.8 (calc)
Triglycerides: 103 mg/dL

## 2024-02-08 LAB — COMPREHENSIVE METABOLIC PANEL WITH GFR
AG Ratio: 2 (calc) (ref 1.0–2.5)
ALT: 49 U/L — ABNORMAL HIGH (ref 9–46)
AST: 37 U/L — ABNORMAL HIGH (ref 10–35)
Albumin: 4.7 g/dL (ref 3.6–5.1)
Alkaline phosphatase (APISO): 71 U/L (ref 35–144)
BUN: 16 mg/dL (ref 7–25)
CO2: 28 mmol/L (ref 20–32)
Calcium: 9.5 mg/dL (ref 8.6–10.3)
Chloride: 103 mmol/L (ref 98–110)
Creat: 0.83 mg/dL (ref 0.70–1.30)
Globulin: 2.3 g/dL (ref 1.9–3.7)
Glucose, Bld: 101 mg/dL — ABNORMAL HIGH (ref 65–99)
Potassium: 4.6 mmol/L (ref 3.5–5.3)
Sodium: 140 mmol/L (ref 135–146)
Total Bilirubin: 0.6 mg/dL (ref 0.2–1.2)
Total Protein: 7 g/dL (ref 6.1–8.1)
eGFR: 101 mL/min/1.73m2

## 2024-02-08 LAB — CBC
HCT: 47.4 % (ref 39.4–51.1)
Hemoglobin: 15.7 g/dL (ref 13.2–17.1)
MCH: 29.8 pg (ref 27.0–33.0)
MCHC: 33.1 g/dL (ref 31.6–35.4)
MCV: 89.9 fL (ref 81.4–101.7)
MPV: 9.3 fL (ref 7.5–12.5)
Platelets: 236 Thousand/uL (ref 140–400)
RBC: 5.27 Million/uL (ref 4.20–5.80)
RDW: 12.3 % (ref 11.0–15.0)
WBC: 6.7 Thousand/uL (ref 3.8–10.8)

## 2024-02-08 LAB — PSA: PSA: 0.58 ng/mL

## 2024-02-08 LAB — HEMOGLOBIN A1C
Hgb A1c MFr Bld: 5.9 % — ABNORMAL HIGH
Mean Plasma Glucose: 123 mg/dL
eAG (mmol/L): 6.8 mmol/L

## 2024-02-10 ENCOUNTER — Ambulatory Visit: Payer: Self-pay | Admitting: Internal Medicine

## 2024-02-15 ENCOUNTER — Other Ambulatory Visit: Payer: Self-pay | Admitting: Internal Medicine

## 2024-02-15 DIAGNOSIS — M48061 Spinal stenosis, lumbar region without neurogenic claudication: Secondary | ICD-10-CM

## 2024-02-17 NOTE — Telephone Encounter (Signed)
 Requested medication (s) are due for refill today: yes  Requested medication (s) are on the active medication list: yes  Last refill:  01/17/24 #60/0  Future visit scheduled: yes  Notes to clinic:  Unable to refill per protocol, cannot delegate.      Requested Prescriptions  Pending Prescriptions Disp Refills   traMADol  (ULTRAM ) 50 MG tablet [Pharmacy Med Name: TRAMADOL  HCL 50 MG TABLET] 60 tablet 0    Sig: TAKE 1 TABLET BY MOUTH 2 TIMES DAILY AS NEEDED.     Not Delegated - Analgesics:  Opioid Agonists Failed - 02/17/2024  2:37 PM      Failed - This refill cannot be delegated      Failed - Urine Drug Screen completed in last 360 days      Passed - Valid encounter within last 3 months    Recent Outpatient Visits           1 week ago Encounter for general adult medical examination with abnormal findings   [facility] [place], Angeline ORN, NP   6 months ago Mixed hyperlipidemia   [place] [facility] [place], Angeline ORN, [place]

## 2024-02-25 ENCOUNTER — Other Ambulatory Visit: Payer: Self-pay | Admitting: Cardiology

## 2024-02-25 DIAGNOSIS — I4891 Unspecified atrial fibrillation: Secondary | ICD-10-CM

## 2024-02-25 NOTE — Telephone Encounter (Signed)
 Eliquis  5mg  refill request received. Patient is 59 years old, weight-87.5kg, Crea-0.83 on 02/07/24, Diagnosis-Afib, and last seen by Dr. Budd Kindle on 01/02/24. Dose is appropriate based on dosing criteria. Will send in refill to requested pharmacy.
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
## 2024-03-03 ENCOUNTER — Telehealth: Payer: Self-pay | Admitting: Internal Medicine

## 2024-03-03 NOTE — Telephone Encounter (Signed)
 Spoke to patient, he will call insurance and find out which inhaler they will cover and let the office know which medication he can afford

## 2024-06-12 ENCOUNTER — Ambulatory Visit

## 2024-06-17 ENCOUNTER — Ambulatory Visit

## 2024-08-05 ENCOUNTER — Ambulatory Visit: Admitting: Internal Medicine
